# Patient Record
Sex: Female | Born: 1985 | Race: White | Hispanic: No | Marital: Single | Smoking: Current every day smoker
Health system: Southern US, Community
[De-identification: ages and names within clinical notes are randomized; demographics above are authoritative.]

## PROBLEM LIST (undated history)

## (undated) ENCOUNTER — Inpatient Hospital Stay (HOSPITAL_COMMUNITY): Payer: Medicaid Other

## (undated) ENCOUNTER — Inpatient Hospital Stay (HOSPITAL_COMMUNITY): Payer: Self-pay

## (undated) DIAGNOSIS — E119 Type 2 diabetes mellitus without complications: Secondary | ICD-10-CM

## (undated) DIAGNOSIS — B009 Herpesviral infection, unspecified: Secondary | ICD-10-CM

## (undated) HISTORY — PX: SHOULDER SURGERY: SHX246

## (undated) HISTORY — PX: APPENDECTOMY: SHX54

## (undated) HISTORY — DX: Herpesviral infection, unspecified: B00.9

---
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
## 2003-10-05 ENCOUNTER — Emergency Department (HOSPITAL_COMMUNITY): Admission: EM | Admit: 2003-10-05 | Discharge: 2003-10-05 | Payer: Self-pay | Admitting: Emergency Medicine

## 2004-04-04 ENCOUNTER — Other Ambulatory Visit: Admission: RE | Admit: 2004-04-04 | Discharge: 2004-04-04 | Payer: Self-pay | Admitting: Obstetrics and Gynecology

## 2004-09-28 ENCOUNTER — Inpatient Hospital Stay (HOSPITAL_COMMUNITY): Admission: AD | Admit: 2004-09-28 | Discharge: 2004-10-01 | Payer: Self-pay | Admitting: Obstetrics and Gynecology

## 2005-07-15 ENCOUNTER — Inpatient Hospital Stay (HOSPITAL_COMMUNITY): Admission: AD | Admit: 2005-07-15 | Discharge: 2005-07-18 | Payer: Self-pay | Admitting: Psychiatry

## 2005-07-16 ENCOUNTER — Ambulatory Visit: Payer: Self-pay | Admitting: Psychiatry

## 2005-07-24 ENCOUNTER — Ambulatory Visit (HOSPITAL_COMMUNITY): Payer: Self-pay | Admitting: Psychiatry

## 2006-01-31 ENCOUNTER — Emergency Department (HOSPITAL_COMMUNITY): Admission: EM | Admit: 2006-01-31 | Discharge: 2006-01-31 | Payer: Self-pay | Admitting: Emergency Medicine

## 2006-02-01 ENCOUNTER — Inpatient Hospital Stay (HOSPITAL_COMMUNITY): Admission: EM | Admit: 2006-02-01 | Discharge: 2006-02-02 | Payer: Self-pay | Admitting: *Deleted

## 2006-02-02 ENCOUNTER — Inpatient Hospital Stay (HOSPITAL_COMMUNITY): Admission: RE | Admit: 2006-02-02 | Discharge: 2006-02-04 | Payer: Self-pay | Admitting: Psychiatry

## 2006-02-03 ENCOUNTER — Ambulatory Visit: Payer: Self-pay | Admitting: Psychiatry

## 2007-05-10 ENCOUNTER — Emergency Department (HOSPITAL_COMMUNITY): Admission: EM | Admit: 2007-05-10 | Discharge: 2007-05-11 | Payer: Self-pay | Admitting: Emergency Medicine

## 2007-11-13 ENCOUNTER — Emergency Department (HOSPITAL_COMMUNITY): Admission: EM | Admit: 2007-11-13 | Discharge: 2007-11-13 | Payer: Self-pay | Admitting: Emergency Medicine

## 2009-02-01 ENCOUNTER — Emergency Department: Payer: Self-pay | Admitting: Emergency Medicine

## 2009-02-02 ENCOUNTER — Emergency Department (HOSPITAL_COMMUNITY): Admission: EM | Admit: 2009-02-02 | Discharge: 2009-02-02 | Payer: Self-pay | Admitting: Emergency Medicine

## 2009-04-29 ENCOUNTER — Observation Stay: Payer: Self-pay

## 2009-05-31 ENCOUNTER — Ambulatory Visit (HOSPITAL_COMMUNITY): Admission: RE | Admit: 2009-05-31 | Discharge: 2009-05-31 | Payer: Self-pay | Admitting: Obstetrics and Gynecology

## 2009-06-28 ENCOUNTER — Ambulatory Visit (HOSPITAL_COMMUNITY): Admission: RE | Admit: 2009-06-28 | Discharge: 2009-06-28 | Payer: Self-pay | Admitting: Obstetrics and Gynecology

## 2009-07-06 ENCOUNTER — Emergency Department (HOSPITAL_COMMUNITY): Admission: EM | Admit: 2009-07-06 | Discharge: 2009-07-06 | Payer: Self-pay | Admitting: Emergency Medicine

## 2009-09-03 ENCOUNTER — Inpatient Hospital Stay (HOSPITAL_COMMUNITY): Admission: AD | Admit: 2009-09-03 | Discharge: 2009-09-03 | Payer: Self-pay | Admitting: Obstetrics and Gynecology

## 2009-09-18 ENCOUNTER — Inpatient Hospital Stay (HOSPITAL_COMMUNITY): Admission: AD | Admit: 2009-09-18 | Discharge: 2009-09-18 | Payer: Self-pay | Admitting: Obstetrics and Gynecology

## 2009-09-20 ENCOUNTER — Encounter (INDEPENDENT_AMBULATORY_CARE_PROVIDER_SITE_OTHER): Payer: Self-pay | Admitting: Obstetrics and Gynecology

## 2009-09-20 ENCOUNTER — Inpatient Hospital Stay (HOSPITAL_COMMUNITY): Admission: AD | Admit: 2009-09-20 | Discharge: 2009-09-22 | Payer: Self-pay | Admitting: Obstetrics and Gynecology

## 2010-07-26 ENCOUNTER — Emergency Department (HOSPITAL_COMMUNITY): Admission: EM | Admit: 2010-07-26 | Discharge: 2010-07-26 | Payer: Self-pay | Admitting: Emergency Medicine

## 2010-11-20 LAB — CBC
HCT: 31.7 % — ABNORMAL LOW (ref 36.0–46.0)
HCT: 37 % (ref 36.0–46.0)
Hemoglobin: 10.6 g/dL — ABNORMAL LOW (ref 12.0–15.0)
Hemoglobin: 12.3 g/dL (ref 12.0–15.0)
MCHC: 33.5 g/dL (ref 30.0–36.0)
MCV: 100 fL (ref 78.0–100.0)
MCV: 100.5 fL — ABNORMAL HIGH (ref 78.0–100.0)
Platelets: 257 10*3/uL (ref 150–400)
RBC: 3.17 MIL/uL — ABNORMAL LOW (ref 3.87–5.11)
RBC: 3.68 MIL/uL — ABNORMAL LOW (ref 3.87–5.11)
RDW: 14.5 % (ref 11.5–15.5)
WBC: 17.2 10*3/uL — ABNORMAL HIGH (ref 4.0–10.5)

## 2010-11-21 ENCOUNTER — Observation Stay (HOSPITAL_COMMUNITY)
Admission: EM | Admit: 2010-11-21 | Discharge: 2010-11-21 | Disposition: A | Discharge status: Home / Self Care | Payer: Self-pay | Attending: Emergency Medicine | Admitting: Emergency Medicine

## 2010-11-21 DIAGNOSIS — K5289 Other specified noninfective gastroenteritis and colitis: Principal | ICD-10-CM | POA: Insufficient documentation

## 2010-11-21 DIAGNOSIS — R109 Unspecified abdominal pain: Secondary | ICD-10-CM | POA: Insufficient documentation

## 2010-11-21 LAB — URINE MICROSCOPIC-ADD ON

## 2010-11-21 LAB — POCT I-STAT, CHEM 8
Calcium, Ion: 1.07 mmol/L — ABNORMAL LOW (ref 1.12–1.32)
Creatinine, Ser: 0.9 mg/dL (ref 0.4–1.2)
Glucose, Bld: 130 mg/dL — ABNORMAL HIGH (ref 70–99)
HCT: 49 % — ABNORMAL HIGH (ref 36.0–46.0)
Hemoglobin: 16.7 g/dL — ABNORMAL HIGH (ref 12.0–15.0)
TCO2: 24 mmol/L (ref 0–100)

## 2010-11-21 LAB — URINALYSIS, ROUTINE W REFLEX MICROSCOPIC
Bilirubin Urine: NEGATIVE
Glucose, UA: NEGATIVE mg/dL
Hgb urine dipstick: NEGATIVE
Protein, ur: NEGATIVE mg/dL
Urobilinogen, UA: 0.2 mg/dL (ref 0.0–1.0)

## 2010-12-05 LAB — URINALYSIS, ROUTINE W REFLEX MICROSCOPIC
Ketones, ur: >80 mg/dL — AB
Nitrite: NEGATIVE
Specific Gravity, Urine: 1.025 (ref 1.005–1.030)
Urobilinogen, UA: 1 mg/dL (ref 0.0–1.0)
pH: 6.5 (ref 5.0–8.0)

## 2010-12-07 LAB — ABO/RH: ABO/RH(D): O POS

## 2010-12-07 LAB — URINALYSIS, ROUTINE W REFLEX MICROSCOPIC
Bilirubin Urine: NEGATIVE
Glucose, UA: NEGATIVE mg/dL
Glucose, UA: NEGATIVE mg/dL
Ketones, ur: NEGATIVE mg/dL
Nitrite: NEGATIVE
Protein, ur: NEGATIVE mg/dL
Specific Gravity, Urine: 1.011 (ref 1.005–1.030)
Specific Gravity, Urine: 1.019 (ref 1.005–1.030)
Urobilinogen, UA: 0.2 mg/dL (ref 0.0–1.0)
pH: 8 (ref 5.0–8.0)

## 2010-12-07 LAB — COMPREHENSIVE METABOLIC PANEL
ALT: 15 U/L (ref 0–35)
AST: 18 U/L (ref 0–37)
Albumin: 3 g/dL — ABNORMAL LOW (ref 3.5–5.2)
Calcium: 9.1 mg/dL (ref 8.4–10.5)
Creatinine, Ser: 0.58 mg/dL (ref 0.4–1.2)
GFR calc Af Amer: >60 mL/min (ref >60)
Sodium: 137 mEq/L (ref 135–145)
Total Protein: 5.8 g/dL — ABNORMAL LOW (ref 6.0–8.3)

## 2010-12-07 LAB — URINE CULTURE

## 2010-12-07 LAB — BASIC METABOLIC PANEL
CO2: 26 mEq/L (ref 19–32)
Chloride: 104 mEq/L (ref 96–112)
GFR calc Af Amer: >60 mL/min (ref >60)
Potassium: 3.5 mEq/L (ref 3.5–5.1)

## 2010-12-07 LAB — DIFFERENTIAL
Eosinophils Absolute: 0.1 10*3/uL (ref 0.0–0.7)
Eosinophils Absolute: 0.1 10*3/uL (ref 0.0–0.7)
Eosinophils Relative: 0 % (ref 0–5)
Eosinophils Relative: 1 % (ref 0–5)
Lymphocytes Relative: 8 % — ABNORMAL LOW (ref 12–46)
Lymphs Abs: 1.6 10*3/uL (ref 0.7–4.0)
Lymphs Abs: 1.8 10*3/uL (ref 0.7–4.0)
Monocytes Absolute: 1.2 10*3/uL — ABNORMAL HIGH (ref 0.1–1.0)
Monocytes Absolute: 1.2 10*3/uL — ABNORMAL HIGH (ref 0.1–1.0)
Monocytes Relative: 6 % (ref 3–12)
Monocytes Relative: 6 % (ref 3–12)

## 2010-12-07 LAB — CBC
HCT: 33 % — ABNORMAL LOW (ref 36.0–46.0)
MCHC: 34 g/dL (ref 30.0–36.0)
MCV: 99.7 fL (ref 78.0–100.0)
MCV: 99.8 fL (ref 78.0–100.0)
Platelets: 265 10*3/uL (ref 150–400)
RBC: 3.07 MIL/uL — ABNORMAL LOW (ref 3.87–5.11)
RBC: 3.32 MIL/uL — ABNORMAL LOW (ref 3.87–5.11)
WBC: 21.4 10*3/uL — ABNORMAL HIGH (ref 4.0–10.5)

## 2010-12-07 LAB — URINE MICROSCOPIC-ADD ON

## 2010-12-12 LAB — URINE CULTURE

## 2010-12-12 LAB — CBC
MCHC: 32.4 g/dL (ref 30.0–36.0)
RBC: 4.14 MIL/uL (ref 3.87–5.11)
WBC: 17.3 10*3/uL — ABNORMAL HIGH (ref 4.0–10.5)

## 2010-12-12 LAB — COMPREHENSIVE METABOLIC PANEL
ALT: 14 U/L (ref 0–35)
AST: 18 U/L (ref 0–37)
CO2: 25 mEq/L (ref 19–32)
Calcium: 9.2 mg/dL (ref 8.4–10.5)
GFR calc Af Amer: >60 mL/min (ref >60)
GFR calc non Af Amer: >60 mL/min (ref >60)
Sodium: 138 mEq/L (ref 135–145)
Total Protein: 6.9 g/dL (ref 6.0–8.3)

## 2010-12-12 LAB — DIFFERENTIAL
Eosinophils Absolute: 0 10*3/uL (ref 0.0–0.7)
Eosinophils Relative: 0 % (ref 0–5)
Lymphs Abs: 1.7 10*3/uL (ref 0.7–4.0)
Monocytes Relative: 4 % (ref 3–12)

## 2010-12-12 LAB — URINALYSIS, ROUTINE W REFLEX MICROSCOPIC
Bilirubin Urine: NEGATIVE
Glucose, UA: NEGATIVE mg/dL
Hgb urine dipstick: NEGATIVE
Ketones, ur: >80 mg/dL — AB
Protein, ur: NEGATIVE mg/dL

## 2011-01-20 NOTE — Discharge Summary (Signed)
Kayla Diaz, Kayla Diaz         ACCOUNT NO.:  [id number]   MEDICAL RECORD NO.:  [id number]          PATIENT TYPE:  INP   LOCATION:  9147                          FACILITY:  [facility]   PHYSICIAN:  Huel Cote, M.D. DATE OF BIRTH:  June 06, 1986   DATE OF ADMISSION:  09/28/2004  DATE OF DISCHARGE:                                 DISCHARGE SUMMARY   DISCHARGE DIAGNOSES:  1.  Term pregnancy at 40+ weeks, delivered.  2.  Positive group B streptococcus status.  3.  Status post normal spontaneous vaginal delivery.   DISCHARGE MEDICATIONS:  1.  Vicodin 5/500 one to two tablets p.o. q.4h.  2.  Motrin 600 mg p.o. q.6h.   DISCHARGE FOLLOW-UP:  The patient is to follow up in the office in 6 weeks  for her routine postpartum exam.   HOSPITAL COURSE:  The patient is a 25 year old G2 P1-0-0-1 who is admitted  in early labor with rupture of membranes.  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  She also had a positive chlamydia test early in  pregnancy which was treated with azithromycin.  Her prenatal labs are as  follows:  O positive, antibody negative, RPR nonreactive, rubella immune,  hepatitis B surface antigen negative, HIV declined, GC negative, chlamydia  positive and treated appropriately, 1-hour Glucola is 118, group B strep is  positive.  The patient was admitted with contractions and leakage of fluid  and on admission she was 3-4 cm dilated with grossly ruptured membranes.  Past medical history:  None.  Past surgical history:  None except for wisdom  tooth extraction.  Past GYN history:  HSV.  Past medical history:  Asthma  and a possible seizure disorder.  Past obstetrical history:  In 2003 she had  a 7-pound 12-ounce female infant delivered with vacuum assistance.  On  physical exam on admission, she was afebrile with stable vital signs.  Fetal  heart rate was reactive.  Abdomen was soft and  nontender.  Fetal heart tones  were normal with no decelerations.  She was given IV penicillin for her  group B strep positive status.  She continued to progress and reached  complete dilation and pushed a viable female infant over an intact perineum,  Apgars were 8 and 9, weight was 7 pounds 1 ounce.  Placenta was intact.  Uterus was normal.  She was then admitted for routine postpartum care.  On  postpartum day #1 her hemoglobin was down to 9.9 from 11.4.  By postpartum  day #2 she was doing very well.  She was switched from Percocet of Vicodin  given some itching associated with the Percocet which was becoming  increasingly problematic and she did well with this.  She was then  considered stable for discharge home with medications and follow-up as  previously stated.      KR/MEDQ  D:  10/01/2004  T:  10/01/2004  Job:  [id number]

## 2011-01-20 NOTE — Op Note (Signed)
[facility]  Patient:    Kayla Diaz, Kayla Diaz Visit Number: [id number] MRN: [id number]          Service Type: OBS Location: 910A 9114 01 Attending Physician:  Michaele Offer Dictated by:   Zenaida Niece, M.D. Proc. Date: 11/23/01 Admit Date:  11/22/2001                             Operative Report  PREOPERATIVE DIAGNOSIS:  POSTOPERATIVE DIAGNOSIS:  OPERATION:  SURGEON:                      Zenaida Niece, M.D.  DESCRIPTION OF PROCEDURE:     The patient reached completely dilated at a +2 station with the vertex in what felt like an ROA position with asynclitism. However, with contractions, the baby did not tolerate this and had prolonged decelerations into the 70s and 80s for three or four minutes with slow recovery to baseline.  Thus, I discussed assisted deliveries with the patient. I discussed risks and options, and the patient agreed to assistance with a vacuum.  The patients Foley catheter was removed.  She had adequate anesthesia with an epidural.  The self-contained mushroom cup vacuum was applied with the vertex again at +2 station.  With four contractions and three pushes with each contraction, the vertex was brought to the perineum.  There were no pop-offs, although the vacuum did lose pressure two or three times, but not completely. The vacuum was removed with the head at the perineum, and the patient pushed the remainder of the vertex out.  The mouth and nares were suctioned.  Loose nuchal cord x2 was reduced.  The remainder of the infant then delivered atraumatically.  The baby was initially fairly floppy, although it did have a good heart rate.  Cord was doubly clamped and cut, and the infant was taken to the warmer where it was stimulated and responded very well.  The placenta then delivered spontaneous and was intact.  The patient had a posterior laceration which was repaired with 3-0 Vicryl _________  epidural for anesthesia. Estimated blood loss was less than 500 cc.  The patient and baby were recovering well. Dictated by:   Zenaida Niece, M.D. Attending Physician:  Michaele Offer DD:  11/23/01 TD:  11/25/01 Job: [id number]
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
## 2011-05-02 ENCOUNTER — Inpatient Hospital Stay (INDEPENDENT_AMBULATORY_CARE_PROVIDER_SITE_OTHER)
Admission: RE | Admit: 2011-05-02 | Discharge: 2011-05-02 | Disposition: A | Discharge status: Home / Self Care | Payer: Medicaid Other | Source: Ambulatory Visit | Attending: Emergency Medicine | Admitting: Emergency Medicine

## 2011-05-02 DIAGNOSIS — J020 Streptococcal pharyngitis: Secondary | ICD-10-CM

## 2011-05-29 LAB — PREGNANCY, URINE: Preg Test, Ur: NEGATIVE

## 2011-05-29 LAB — ETHANOL: Alcohol, Ethyl (B): <5

## 2011-05-29 LAB — URINE MICROSCOPIC-ADD ON

## 2011-05-29 LAB — BASIC METABOLIC PANEL
Chloride: 102
GFR calc Af Amer: >60
Potassium: 3.5
Sodium: 136

## 2011-05-29 LAB — URINALYSIS, ROUTINE W REFLEX MICROSCOPIC
Glucose, UA: NEGATIVE
Hgb urine dipstick: NEGATIVE
Ketones, ur: 40 — AB
Leukocytes, UA: NEGATIVE
pH: 6

## 2011-05-29 LAB — DIFFERENTIAL
Eosinophils Absolute: 0
Eosinophils Relative: 0
Lymphs Abs: 1.7
Monocytes Relative: 7

## 2011-05-29 LAB — CBC
HCT: 38.9
MCV: 93.7
RBC: 4.16
WBC: 10.7 — ABNORMAL HIGH

## 2011-05-29 LAB — RAPID URINE DRUG SCREEN, HOSP PERFORMED
Barbiturates: NOT DETECTED
Cocaine: NOT DETECTED

## 2011-06-11 ENCOUNTER — Ambulatory Visit (INDEPENDENT_AMBULATORY_CARE_PROVIDER_SITE_OTHER): Payer: Medicaid Other

## 2011-06-11 ENCOUNTER — Inpatient Hospital Stay (INDEPENDENT_AMBULATORY_CARE_PROVIDER_SITE_OTHER)
Admission: RE | Admit: 2011-06-11 | Discharge: 2011-06-11 | Disposition: A | Discharge status: Home / Self Care | Payer: Medicaid Other | Source: Ambulatory Visit | Attending: Emergency Medicine | Admitting: Emergency Medicine

## 2011-06-11 DIAGNOSIS — S0003XA Contusion of scalp, initial encounter: Secondary | ICD-10-CM

## 2011-06-16 LAB — RAPID STREP SCREEN (MED CTR MEBANE ONLY): Streptococcus, Group A Screen (Direct): NEGATIVE

## 2011-07-25 ENCOUNTER — Emergency Department: Payer: Self-pay | Admitting: Unknown Physician Specialty

## 2011-08-05 ENCOUNTER — Emergency Department: Payer: Self-pay | Admitting: Emergency Medicine

## 2011-08-08 ENCOUNTER — Emergency Department: Payer: Self-pay | Admitting: Emergency Medicine

## 2011-08-31 ENCOUNTER — Encounter: Payer: Self-pay | Admitting: *Deleted

## 2011-08-31 ENCOUNTER — Emergency Department (HOSPITAL_COMMUNITY)
Admission: EM | Admit: 2011-08-31 | Discharge: 2011-08-31 | Disposition: A | Discharge status: Home / Self Care | Payer: Medicaid Other | Source: Home / Self Care | Attending: Family Medicine | Admitting: Family Medicine

## 2011-08-31 DIAGNOSIS — B999 Unspecified infectious disease: Secondary | ICD-10-CM

## 2011-08-31 DIAGNOSIS — L738 Other specified follicular disorders: Secondary | ICD-10-CM

## 2011-08-31 MED ORDER — DOXYCYCLINE HYCLATE 100 MG PO CAPS: 100.0000 mg | ORAL_CAPSULE | Freq: Two times a day (BID) | ORAL | Status: DC

## 2011-08-31 NOTE — ED Provider Notes (Signed)
History     CSN: [id number]  Arrival date & time 08/31/11  1629   First MD Initiated Contact with Patient 08/31/11 1635      Chief Complaint  Patient presents with  . Abscess    (Consider location/radiation/quality/duration/timing/severity/associated sxs/prior treatment) Patient is a 25 y.o. female presenting with abscess. The history is provided by the patient.  Abscess  This is a new problem. The current episode started yesterday. The onset was gradual. The problem has been gradually worsening. The abscess is present on the face. The problem is mild. The abscess is characterized by redness and painfulness. It is unknown what she was exposed to. The abscess first occurred at home.    History reviewed. No pertinent past medical history.  Past Surgical History  Procedure Date  . Shoulder surgery     History reviewed. No pertinent family history.  History  Substance Use Topics  . Smoking status: Current Everyday Smoker  . Smokeless tobacco: Not on file  . Alcohol Use: No    OB History    Grav Para Term Preterm Abortions TAB SAB Ect Mult Living                  Review of Systems  Constitutional: Negative.   Skin: Positive for rash.    Allergies  Review of patient's allergies indicates no known allergies.  Home Medications   Current Outpatient Rx  Name Route Sig Dispense Refill  . DOXYCYCLINE HYCLATE 100 MG PO CAPS Oral Take 1 capsule (100 mg total) by mouth 2 (two) times daily. 30 capsule 0    BP 115/72  Pulse 96  Temp(Src) 98.4 F (36.9 C) (Oral)  Resp 22  SpO2 100%  Physical Exam  Constitutional: She appears well-developed and well-nourished.  HENT:  Head: Normocephalic.  Skin: Skin is warm and dry. Rash noted. There is erythema.       ED Course  Procedures (including critical care time)  Labs Reviewed - No data to display No results found.   1. Infectious folliculitis       MDM          Barkley Bruns, MD 09/01/11  [phone]

## 2011-08-31 NOTE — ED Notes (Signed)
Pt  Noticed  Small  Red       Area   Which  She  Noticed      She  scubbed   It    And  It  Got  Worse    Early  This  Am

## 2011-09-01 ENCOUNTER — Telehealth (HOSPITAL_COMMUNITY): Payer: Self-pay | Admitting: *Deleted

## 2011-09-01 ENCOUNTER — Telehealth (HOSPITAL_COMMUNITY): Payer: Self-pay | Admitting: Family Medicine

## 2011-09-01 MED ORDER — CLINDAMYCIN HCL 300 MG PO CAPS: 300.0000 mg | ORAL_CAPSULE | Freq: Four times a day (QID) | ORAL | Status: AC

## 2011-09-01 NOTE — Telephone Encounter (Signed)
Patient called stating that she is pregnant therefore doxycycline is not safe. Plan to switch to clindamycin prescription called into pharmacy.

## 2011-09-23 ENCOUNTER — Emergency Department (HOSPITAL_COMMUNITY)
Admission: EM | Admit: 2011-09-23 | Discharge: 2011-09-24 | Disposition: A | Discharge status: Home / Self Care | Payer: Medicaid Other | Attending: Emergency Medicine | Admitting: Emergency Medicine

## 2011-09-23 ENCOUNTER — Encounter (HOSPITAL_COMMUNITY): Payer: Self-pay | Admitting: *Deleted

## 2011-09-23 DIAGNOSIS — O034 Incomplete spontaneous abortion without complication: Secondary | ICD-10-CM

## 2011-09-23 DIAGNOSIS — R109 Unspecified abdominal pain: Secondary | ICD-10-CM | POA: Insufficient documentation

## 2011-09-23 DIAGNOSIS — N898 Other specified noninflammatory disorders of vagina: Secondary | ICD-10-CM | POA: Insufficient documentation

## 2011-09-23 NOTE — ED Notes (Signed)
Patient is about [redacted] weeks pregnant and today she noticed blood clots and bleeding.  Patient is also experiencing abdominal cramps

## 2011-09-24 ENCOUNTER — Emergency Department: Payer: Self-pay | Admitting: Emergency Medicine

## 2011-09-24 LAB — CBC
HCT: 37.2 % (ref 35.0–47.0)
HGB: 12.9 g/dL (ref 12.0–16.0)
Hemoglobin: 13 g/dL (ref 12.0–15.0)
MCH: 32.4 pg (ref 26.0–34.0)
MCH: 33.8 pg (ref 26.0–34.0)
MCV: 97 fL (ref 80–100)
Platelet: 240 10*3/uL (ref 150–440)
RBC: 4.01 MIL/uL (ref 3.87–5.11)
RBC: 3.83 10*6/uL (ref 3.80–5.20)
RDW: 13.2 % (ref 11.5–14.5)
WBC: 16.4 10*3/uL — ABNORMAL HIGH (ref 3.6–11.0)

## 2011-09-24 LAB — DIFFERENTIAL
Lymphs Abs: 2.9 10*3/uL (ref 0.7–4.0)
Monocytes Relative: 6 % (ref 3–12)
Neutro Abs: 10.9 10*3/uL — ABNORMAL HIGH (ref 1.7–7.7)
Neutrophils Relative %: 74 % (ref 43–77)

## 2011-09-24 LAB — BASIC METABOLIC PANEL
Anion Gap: 10 (ref 7–16)
BUN: 9 mg/dL (ref 7–18)
CO2: 26 mEq/L (ref 19–32)
Chloride: 102 mEq/L (ref 96–112)
Chloride: 106 mmol/L (ref 98–107)
Creatinine: 0.61 mg/dL (ref 0.60–1.30)
EGFR (Non-African Amer.): >60
Glucose: 80 mg/dL (ref 65–99)
Osmolality: 283 (ref 275–301)
Sodium: 138 mEq/L (ref 135–145)
Sodium: 143 mmol/L (ref 136–145)

## 2011-09-24 LAB — ABO/RH: ABO/RH(D): O POS

## 2011-09-24 MED ORDER — POTASSIUM CHLORIDE CRYS ER 20 MEQ PO TBCR
20.0000 meq | EXTENDED_RELEASE_TABLET | Freq: Once | ORAL | Status: AC
  Administered 2011-09-24: 20 meq via ORAL

## 2011-09-24 MED ORDER — ACETAMINOPHEN 325 MG PO TABS
650.0000 mg | ORAL_TABLET | Freq: Once | ORAL | Status: DC
  Filled 2011-09-24: qty 2

## 2011-09-24 MED ORDER — POTASSIUM CHLORIDE CRYS ER 20 MEQ PO TBCR
EXTENDED_RELEASE_TABLET | ORAL | Status: AC
  Administered 2011-09-24: 20 meq via ORAL
  Filled 2011-09-24: qty 1

## 2011-09-24 NOTE — ED Notes (Signed)
Fetal heart tone not heard.

## 2011-09-24 NOTE — ED Provider Notes (Signed)
History     CSN: [id number]  Arrival date & time 09/23/11  2341   First MD Initiated Contact with Patient 09/24/11 0036      Chief Complaint  Patient presents with  . Miscarriage    (Consider location/radiation/quality/duration/timing/severity/associated sxs/prior treatment) The history is provided by the patient.   vaginal bleeding. Onset today. Patient believes she is about [redacted] weeks pregnant. She went to the emergency department and [place] about 7 weeks ago and told she was around [redacted] weeks pregnant. She was given referral [facility], but has not followed up yet. She is taking prenatal vitamins. She is G4 P3 with 3 healthy children. She has not had problems with this pregnancy until today. She has some mild lower bowel cramping. When she started passing blood she presented occurs the emergency department where she was told she may be having a miscarriage. She passed more blood after going home and became concerned so now presents here. She denies any fever, nausea or vomiting. No history of same. Patient is very anxious and concerned about a possible miscarriage. No passage of tissue. No dizziness or near syncope. Moderate in severity. Currently no pain or bleeding.  History reviewed. No pertinent past medical history.  Past Surgical History  Procedure Date  . Shoulder surgery     History reviewed. No pertinent family history.  History  Substance Use Topics  . Smoking status: Current Everyday Smoker  . Smokeless tobacco: Not on file  . Alcohol Use: No    OB History    Grav Para Term Preterm Abortions TAB SAB Ect Mult Living   4 3              Review of Systems  Constitutional: Negative for fever and chills.  HENT: Negative for neck pain and neck stiffness.   Eyes: Negative for pain.  Respiratory: Negative for shortness of breath.   Cardiovascular: Negative for chest pain.  Gastrointestinal: Negative for nausea, vomiting, abdominal pain and diarrhea.    Genitourinary: Positive for vaginal bleeding. Negative for dysuria, urgency, flank pain, vaginal pain and pelvic pain.  Musculoskeletal: Negative for back pain.  Skin: Negative for rash.  Neurological: Negative for headaches.  All other systems reviewed and are negative.    Allergies  Prednisone  Home Medications  No current outpatient prescriptions on file.  BP 113/67  Pulse 95  Temp(Src) 97.8 F (36.6 C) (Oral)  Resp 20  SpO2 97%  Physical Exam  Constitutional: She is oriented to person, place, and time. She appears well-developed and well-nourished.  HENT:  Head: Normocephalic and atraumatic.  Eyes: Conjunctivae and EOM are normal. Pupils are equal, round, and reactive to light.  Neck: Trachea normal. Neck supple.  Cardiovascular: Normal rate, regular rhythm, S1 normal, S2 normal and normal pulses.     No systolic murmur is present   No diastolic murmur is present  Pulses:      Radial pulses are 2+ on the right side, and 2+ on the left side.  Pulmonary/Chest: Effort normal and breath sounds normal. She has no rhonchi.  Abdominal: Soft. Normal appearance and bowel sounds are normal. There is no tenderness. There is no rebound, no guarding, no CVA tenderness and negative Murphy's sign.       Gravid uterus  Musculoskeletal:       BLE:s Calves nontender, no cords or erythema, negative Homans sign  Neurological: She is alert and oriented to person, place, and time. She has normal strength. No cranial nerve deficit or  sensory deficit. GCS eye subscore is 4. GCS verbal subscore is 5. GCS motor subscore is 6.  Skin: Skin is warm and dry. No rash noted. She is not diaphoretic.  Psychiatric: Her speech is normal.       Cooperative and appropriate    ED Course  [place] bedside Date/Time: 09/24/2011 1:36 AM Performed by: Sunnie Nielsen Authorized by: Sunnie Nielsen Consent: Verbal consent obtained. Risks and benefits: risks, benefits and alternatives were discussed Consent given by:  patient Patient understanding: patient states understanding of the procedure being performed Patient consent: the patient's understanding of the procedure matches consent given Procedure consent: procedure consent matches procedure scheduled Patient identity confirmed: verbally with patient Time out: Immediately prior to procedure a "time out" was called to verify the correct patient, procedure, equipment, support staff and site/side marked as required. Preparation: Patient was prepped and draped in the usual sterile fashion. Comments: Indication pregnancy. Enlarged uterus. Fetal products with No evidence of cardiac activity. Fetal heart tones assessed without any fetal heart tones appreciated.   (including critical care time)  Labs Reviewed  CBC - Abnormal; Notable for the following:    WBC 14.8 (*)    All other components within normal limits  DIFFERENTIAL - Abnormal; Notable for the following:    Neutro Abs 10.9 (*)    All other components within normal limits  ABO/RH  HCG, QUANTITATIVE, PREGNANCY  BASIC METABOLIC PANEL   Limited bedside Ultrasound results shared with patient, likely miscarriage. Quant beta hCG sent and reck OB/GYN followup on Monday in clinic. Patient states understanding discharge instructions and need to be evaluated for any symptoms of heavy bleeding with associated symptoms of anemia.   MDM   Vaginal bleeding first trimester likely miscarriage. Patient given instructions for threatened miscarriage and plan close OB/GYN followup. She is stable for discharge home. All questions answered.       Sunnie Nielsen, MD 09/24/11 [phone]

## 2011-09-24 NOTE — ED Notes (Signed)
Advised MD patient refused acetaminophen.  Asked if I could administer Bentyl instead.

## 2011-09-24 NOTE — ED Notes (Signed)
Patient is AOx4 and understands her discharge instructions. 

## 2011-11-21 ENCOUNTER — Encounter (HOSPITAL_COMMUNITY): Payer: Self-pay | Admitting: Emergency Medicine

## 2011-11-21 ENCOUNTER — Emergency Department (HOSPITAL_COMMUNITY)
Admission: EM | Admit: 2011-11-21 | Discharge: 2011-11-21 | Disposition: A | Discharge status: Home / Self Care | Payer: Self-pay | Attending: Emergency Medicine | Admitting: Emergency Medicine

## 2011-11-21 DIAGNOSIS — F17200 Nicotine dependence, unspecified, uncomplicated: Secondary | ICD-10-CM | POA: Insufficient documentation

## 2011-11-21 DIAGNOSIS — R197 Diarrhea, unspecified: Secondary | ICD-10-CM | POA: Insufficient documentation

## 2011-11-21 DIAGNOSIS — Z9889 Other specified postprocedural states: Secondary | ICD-10-CM | POA: Insufficient documentation

## 2011-11-21 DIAGNOSIS — R112 Nausea with vomiting, unspecified: Secondary | ICD-10-CM | POA: Insufficient documentation

## 2011-11-21 DIAGNOSIS — IMO0001 Reserved for inherently not codable concepts without codable children: Secondary | ICD-10-CM | POA: Insufficient documentation

## 2011-11-21 LAB — DIFFERENTIAL
Eosinophils Relative: 0 % (ref 0–5)
Lymphocytes Relative: 7 % — ABNORMAL LOW (ref 12–46)
Lymphs Abs: 0.8 10*3/uL (ref 0.7–4.0)
Monocytes Absolute: 0.5 10*3/uL (ref 0.1–1.0)

## 2011-11-21 LAB — URINALYSIS, ROUTINE W REFLEX MICROSCOPIC
Glucose, UA: NEGATIVE mg/dL
Hgb urine dipstick: NEGATIVE
Leukocytes, UA: NEGATIVE
Protein, ur: NEGATIVE mg/dL
Specific Gravity, Urine: 1.03 (ref 1.005–1.030)

## 2011-11-21 LAB — COMPREHENSIVE METABOLIC PANEL
ALT: 46 U/L — ABNORMAL HIGH (ref 0–35)
CO2: 24 mEq/L (ref 19–32)
Calcium: 9.3 mg/dL (ref 8.4–10.5)
Creatinine, Ser: 0.75 mg/dL (ref 0.50–1.10)
GFR calc Af Amer: >90 mL/min (ref >90)
GFR calc non Af Amer: >90 mL/min (ref >90)
Glucose, Bld: 128 mg/dL — ABNORMAL HIGH (ref 70–99)
Sodium: 135 mEq/L (ref 135–145)
Total Bilirubin: 0.3 mg/dL (ref 0.3–1.2)

## 2011-11-21 LAB — POCT I-STAT, CHEM 8
BUN: 15 mg/dL (ref 6–23)
Hemoglobin: 15.3 g/dL — ABNORMAL HIGH (ref 12.0–15.0)
Potassium: 3.7 mEq/L (ref 3.5–5.1)
Sodium: 141 mEq/L (ref 135–145)
TCO2: 23 mmol/L (ref 0–100)

## 2011-11-21 LAB — POCT PREGNANCY, URINE: Preg Test, Ur: NEGATIVE

## 2011-11-21 LAB — CBC
HCT: 40.3 % (ref 36.0–46.0)
MCV: 91.4 fL (ref 78.0–100.0)
RBC: 4.41 MIL/uL (ref 3.87–5.11)
WBC: 10.9 10*3/uL — ABNORMAL HIGH (ref 4.0–10.5)

## 2011-11-21 MED ORDER — OXYCODONE-ACETAMINOPHEN 5-325 MG PO TABS
1.0000 | ORAL_TABLET | Freq: Once | ORAL | Status: AC
  Administered 2011-11-21: 1 via ORAL
  Filled 2011-11-21: qty 1

## 2011-11-21 MED ORDER — PROMETHAZINE HCL 25 MG/ML IJ SOLN
12.5000 mg | Freq: Once | INTRAMUSCULAR | Status: AC
  Administered 2011-11-21: 07:00:00 via INTRAVENOUS
  Filled 2011-11-21: qty 1

## 2011-11-21 MED ORDER — SODIUM CHLORIDE 0.9 % IV SOLN
Freq: Once | INTRAVENOUS | Status: AC
  Administered 2011-11-21: 03:00:00 via INTRAVENOUS

## 2011-11-21 MED ORDER — PROMETHAZINE HCL 25 MG RE SUPP: 25.0000 mg | Freq: Four times a day (QID) | RECTAL | Status: DC | PRN

## 2011-11-21 MED ORDER — SODIUM CHLORIDE 0.9 % IV BOLUS (SEPSIS)
1000.0000 mL | Freq: Once | INTRAVENOUS | Status: AC
  Administered 2011-11-21: 1000 mL via INTRAVENOUS

## 2011-11-21 MED ORDER — PROMETHAZINE HCL 25 MG/ML IJ SOLN
12.5000 mg | Freq: Once | INTRAMUSCULAR | Status: AC
  Administered 2011-11-21: 12.5 mg via INTRAVENOUS
  Filled 2011-11-21: qty 1

## 2011-11-21 NOTE — ED Notes (Signed)
Pt found laying in the hallway in triage  Pt states she is unable to get up  Told pt to get up and go back to her room  After much difficulty pt got up and back to her triage room  Pt crying

## 2011-11-21 NOTE — ED Provider Notes (Signed)
History     CSN: [id number]  Arrival date & time 11/21/11  0117   First MD Initiated Contact with Patient 11/21/11 0247      Chief Complaint  Patient presents with  . Emesis  . Nausea  . Generalized Body Aches    (Consider location/radiation/quality/duration/timing/severity/associated sxs/prior treatment) Patient is a 26 y.o. female presenting with vomiting. The history is provided by the patient. No language interpreter was used.  Emesis  This is a new problem. The current episode started 1 to 2 hours ago. The problem occurs 2 to 4 times per day. The problem has not changed since onset.The emesis has an appearance of stomach contents. There has been no fever. Associated symptoms include diarrhea and myalgias. Pertinent negatives include no cough and no URI. Risk factors: unknown.    History reviewed. No pertinent past medical history.  Past Surgical History  Procedure Date  . Shoulder surgery     Family History  Problem Relation Age of Onset  . Cancer Other     History  Substance Use Topics  . Smoking status: Current Everyday Smoker    Types: Cigarettes  . Smokeless tobacco: Not on file  . Alcohol Use: No    OB History    Grav Para Term Preterm Abortions TAB SAB Ect Mult Living   4 3              Review of Systems  Constitutional: Negative.   HENT: Negative.   Eyes: Negative.   Respiratory: Negative for cough.   Cardiovascular: Negative.   Gastrointestinal: Positive for nausea, vomiting and diarrhea.  Genitourinary: Negative.   Musculoskeletal: Positive for myalgias.  Neurological: Negative.   Hematological: Negative.   Psychiatric/Behavioral: Negative.     Allergies  Zofran and Prednisone  Home Medications   Current Outpatient Rx  Name Route Sig Dispense Refill  . PROMETHAZINE HCL 25 MG RE SUPP Rectal Place 1 suppository (25 mg total) rectally every 6 (six) hours as needed for nausea. 12 each 0    BP 104/67  Pulse 103  Temp(Src) 99.5 F  (37.5 C) (Oral)  Resp 18  SpO2 98%  LMP 11/04/2011  Breastfeeding? Unknown  Physical Exam  Constitutional: She is oriented to person, place, and time. She appears well-developed and well-nourished. No distress.  HENT:  Head: Normocephalic.  Mouth/Throat: Oropharynx is clear and moist.  Eyes: Conjunctivae are normal. Pupils are equal, round, and reactive to light.  Neck: Normal range of motion. Neck supple.  Cardiovascular: Normal rate and regular rhythm.   Pulmonary/Chest: Effort normal and breath sounds normal.  Abdominal: Soft. Bowel sounds are normal. There is no tenderness. There is no rebound and no guarding.  Musculoskeletal: Normal range of motion.  Neurological: She is alert and oriented to person, place, and time.  Skin: Skin is warm and dry.  Psychiatric: She has a normal mood and affect.    ED Course  Procedures (including critical care time)  Labs Reviewed  CBC - Abnormal; Notable for the following:    WBC 10.9 (*)    All other components within normal limits  DIFFERENTIAL - Abnormal; Notable for the following:    Neutrophils Relative 87 (*)    Neutro Abs 9.5 (*)    Lymphocytes Relative 7 (*)    All other components within normal limits  COMPREHENSIVE METABOLIC PANEL - Abnormal; Notable for the following:    Glucose, Bld 128 (*)    AST 41 (*)    ALT 46 (*)  All other components within normal limits  URINALYSIS, ROUTINE W REFLEX MICROSCOPIC - Abnormal; Notable for the following:    APPearance CLOUDY (*)    Bilirubin Urine SMALL (*)    Ketones, ur 40 (*)    All other components within normal limits  POCT I-STAT, CHEM 8 - Abnormal; Notable for the following:    Glucose, Bld 127 (*)    Calcium, Ion 1.11 (*)    Hemoglobin 15.3 (*)    All other components within normal limits  POCT PREGNANCY, URINE   No results found.   1. Nausea vomiting and diarrhea       MDM  History and exam consistent with viral illness. Reassuring, no indications for imaging  at this time        Edgard Debord Smitty Cords, MD 11/21/11 [phone]

## 2011-11-21 NOTE — ED Notes (Signed)
Pt states that she might be pregnant and had her period 2.5 weeks ago.

## 2011-11-21 NOTE — ED Notes (Signed)
Pt drank Sprite and 1 cracker, no new reports of vomiting, Dr. Nicanor Alcon aware, will monitor.

## 2011-11-21 NOTE — Discharge Instructions (Signed)
B.R.A.T. Diet Your doctor has recommended the B.R.A.T. diet for you or your child until the condition improves. This is often used to help control diarrhea and vomiting symptoms. If you or your child can tolerate clear liquids, you may have:  Bananas.   Rice.   Applesauce.   Toast (and other simple starches such as crackers, potatoes, noodles).  Be sure to avoid dairy products, meats, and fatty foods until symptoms are better. Fruit juices such as apple, grape, and prune juice can make diarrhea worse. Avoid these. Continue this diet for 2 days or as instructed by your caregiver. Document Released: 08/21/2005 Document Revised: 08/10/2011 Document Reviewed: 02/07/2007 Exit[employer] Patient Information 2012 [employer]. 

## 2011-11-21 NOTE — ED Notes (Signed)
Pt states tonight starting around 10pm she started having nausea, vomiting, and diarrhea and has not been able to stop since  Pt actively vomiting in triage and has been back and forth to the restroom several times  Pt is c/o abd pain as well

## 2011-11-22 LAB — GLUCOSE, CAPILLARY: Glucose-Capillary: 126 mg/dL — ABNORMAL HIGH (ref 70–99)

## 2011-12-28 ENCOUNTER — Encounter (HOSPITAL_COMMUNITY): Payer: Self-pay | Admitting: *Deleted

## 2011-12-28 ENCOUNTER — Emergency Department (HOSPITAL_COMMUNITY): Payer: Self-pay

## 2011-12-28 ENCOUNTER — Emergency Department (HOSPITAL_COMMUNITY)
Admission: EM | Admit: 2011-12-28 | Discharge: 2011-12-28 | Disposition: A | Discharge status: Home / Self Care | Payer: Self-pay | Attending: Emergency Medicine | Admitting: Emergency Medicine

## 2011-12-28 DIAGNOSIS — R0789 Other chest pain: Secondary | ICD-10-CM

## 2011-12-28 DIAGNOSIS — R072 Precordial pain: Secondary | ICD-10-CM | POA: Insufficient documentation

## 2011-12-28 DIAGNOSIS — F17200 Nicotine dependence, unspecified, uncomplicated: Secondary | ICD-10-CM | POA: Insufficient documentation

## 2011-12-28 DIAGNOSIS — M549 Dorsalgia, unspecified: Secondary | ICD-10-CM | POA: Insufficient documentation

## 2011-12-28 DIAGNOSIS — R0602 Shortness of breath: Secondary | ICD-10-CM | POA: Insufficient documentation

## 2011-12-28 LAB — COMPREHENSIVE METABOLIC PANEL
ALT: 19 U/L (ref 0–35)
AST: 21 U/L (ref 0–37)
Albumin: 3.7 g/dL (ref 3.5–5.2)
Calcium: 9 mg/dL (ref 8.4–10.5)
Sodium: 138 mEq/L (ref 135–145)
Total Protein: 6.3 g/dL (ref 6.0–8.3)

## 2011-12-28 LAB — DIFFERENTIAL
Basophils Absolute: 0 10*3/uL (ref 0.0–0.1)
Eosinophils Absolute: 0.2 10*3/uL (ref 0.0–0.7)
Eosinophils Relative: 3 % (ref 0–5)
Neutrophils Relative %: 51 % (ref 43–77)

## 2011-12-28 LAB — CBC
MCH: 29.6 pg (ref 26.0–34.0)
MCV: 88.1 fL (ref 78.0–100.0)
Platelets: 174 10*3/uL (ref 150–400)
RDW: 13.6 % (ref 11.5–15.5)
WBC: 7 10*3/uL (ref 4.0–10.5)

## 2011-12-28 LAB — D-DIMER, QUANTITATIVE: D-Dimer, Quant: 0.24 ug/mL-FEU (ref 0.00–0.48)

## 2011-12-28 LAB — CK TOTAL AND CKMB (NOT AT ARMC): Relative Index: INVALID (ref 0.0–2.5)

## 2011-12-28 LAB — TROPONIN I: Troponin I: <0.3 ng/mL (ref <0.30)

## 2011-12-28 MED ORDER — KETOROLAC TROMETHAMINE 60 MG/2ML IM SOLN
60.0000 mg | Freq: Once | INTRAMUSCULAR | Status: AC
  Administered 2011-12-28: 60 mg via INTRAMUSCULAR
  Filled 2011-12-28: qty 2

## 2011-12-28 MED ORDER — NAPROXEN 500 MG PO TABS: 500.0000 mg | ORAL_TABLET | Freq: Two times a day (BID) | ORAL | Status: DC

## 2011-12-28 MED ORDER — IBUPROFEN 800 MG PO TABS
800.0000 mg | ORAL_TABLET | Freq: Once | ORAL | Status: DC
  Filled 2011-12-28: qty 1

## 2011-12-28 NOTE — Discharge Instructions (Signed)
Chest Wall Pain Chest wall pain is pain in or around the bones and muscles of your chest. It may take up to 6 weeks to get better. It may take longer if you must stay physically active in your work and activities.  CAUSES  Chest wall pain may happen on its own. However, it may be caused by:  A viral illness like the flu.   Injury.   Coughing.   Exercise.   Arthritis.   Fibromyalgia.   Shingles.  HOME CARE INSTRUCTIONS   Avoid overtiring physical activity. Try not to strain or perform activities that cause pain. This includes any activities using your chest or your abdominal and side muscles, especially if heavy weights are used.   Put ice on the sore area.   Put ice in a plastic bag.   Place a towel between your skin and the bag.   Leave the ice on for 15 to 20 minutes per hour while awake for the first 2 days.   Only take over-the-counter or prescription medicines for pain, discomfort, or fever as directed by your caregiver.  SEEK IMMEDIATE MEDICAL CARE IF:   Your pain increases, or you are very uncomfortable.   You have a fever.   Your chest pain becomes worse.   You have new, unexplained symptoms.   You have nausea or vomiting.   You feel sweaty or lightheaded.   You have a cough with phlegm (sputum), or you cough up blood.  MAKE SURE YOU:   Understand these instructions.   Will watch your condition.   Will get help right away if you are not doing well or get worse.  Document Released: 08/21/2005 Document Revised: 08/10/2011 Document Reviewed: 04/17/2011 [employer] Patient Information 2012 [employer]. 

## 2011-12-28 NOTE — ED Provider Notes (Signed)
History     CSN: [id number]  Arrival date & time 12/28/11  1925   First MD Initiated Contact with Patient 12/28/11 2044      Chief Complaint  Patient presents with  . Chest Pain    (Consider location/radiation/quality/duration/timing/severity/associated sxs/prior treatment) HPI Comments: Patient presents with sudden onset substernal chest pain that occurred while she was driving in his lasted about 2 hours. It is since improved, still somewhat better. The pain radiates to her back associated with shortness of breath. She is a smoker but denies any other medical history. She has never had this before. She denies any cough or fever. She is not on any birth control. Denies abdominal pain, nausea or vomiting. No leg pain, swelling or recent travel  The history is provided by the patient.    History reviewed. No pertinent past medical history.  Past Surgical History  Procedure Date  . Shoulder surgery     Family History  Problem Relation Age of Onset  . Cancer Other     History  Substance Use Topics  . Smoking status: Current Everyday Smoker    Types: Cigarettes  . Smokeless tobacco: Not on file  . Alcohol Use: No    OB History    Grav Para Term Preterm Abortions TAB SAB Ect Mult Living   4 3              Review of Systems  Constitutional: Negative for fever, activity change and appetite change.  HENT: Negative for congestion and rhinorrhea.   Respiratory: Positive for chest tightness and shortness of breath. Negative for cough.   Cardiovascular: Positive for chest pain. Negative for leg swelling.  Gastrointestinal: Negative for nausea, vomiting and abdominal pain.  Genitourinary: Negative for dysuria.  Neurological: Negative for dizziness and headaches.    Allergies  Peanut-containing drug products; Ibuprofen; Zofran; Chocolate; and Prednisone  Home Medications   Current Outpatient Rx  Name Route Sig Dispense Refill  . NAPROXEN 500 MG PO TABS Oral Take 1  tablet (500 mg total) by mouth 2 (two) times daily. 30 tablet 0    BP 125/78  Pulse 80  Temp(Src) 97.7 F (36.5 C) (Oral)  Resp 18  SpO2 100%  LMP 12/07/2011  Physical Exam  Constitutional: She is oriented to person, place, and time. She appears well-developed. No distress.  HENT:  Head: Normocephalic and atraumatic.  Mouth/Throat: Oropharynx is clear and moist. No oropharyngeal exudate.  Eyes: Conjunctivae are normal. Pupils are equal, round, and reactive to light.  Neck: Normal range of motion. Neck supple.  Cardiovascular: Normal rate, regular rhythm and normal heart sounds.   No murmur heard. Pulmonary/Chest: Effort normal and breath sounds normal. No respiratory distress. She exhibits tenderness.       Somewhat reproducible  Abdominal: Soft. There is no tenderness. There is no rebound and no guarding.  Musculoskeletal: Normal range of motion. She exhibits no edema and no tenderness.       No swelling, asymmetry or palpable cords  Neurological: She is alert and oriented to person, place, and time. No cranial nerve deficit.  Skin: Skin is warm.    ED Course  Procedures (including critical care time)  Labs Reviewed  CBC - Abnormal; Notable for the following:    Hemoglobin 11.9 (*)    HCT 35.4 (*)    All other components within normal limits  COMPREHENSIVE METABOLIC PANEL - Abnormal; Notable for the following:    Total Bilirubin 0.1 (*)    All other  components within normal limits  DIFFERENTIAL  CK TOTAL AND CKMB  TROPONIN I  D-DIMER, QUANTITATIVE  URINE RAPID DRUG SCREEN (HOSP PERFORMED)   Dg Chest 2 View  12/28/2011  *RADIOLOGY REPORT*  Clinical Data: Chest pain  CHEST - 2 VIEW  Comparison: 01/31/2006  Findings: Cardiomediastinal silhouette is within normal limits. The lungs are clear. No pleural effusion.  No pneumothorax.  No acute osseous abnormality.  IMPRESSION: Normal chest.  Original Report Authenticated By: Harrel Lemon, M.D.     1. Chest wall pain         MDM  Chest wall pain, improving. Vitals stable, lungs clear.  CXR negative. Pain improved with toradol. PERC negative.     Date: 12/28/2011  Rate: 87  Rhythm: normal sinus rhythm  QRS Axis: normal  Intervals: normal  ST/T Wave abnormalities: normal  Conduction Disutrbances:none  Narrative Interpretation:   Old EKG Reviewed: none available          Glynn Octave, MD 12/28/11 2337

## 2011-12-28 NOTE — ED Notes (Signed)
The pt was driving and she started having severe chest pain  And some sob.  No previous history.

## 2011-12-28 NOTE — ED Notes (Signed)
PT refuses chest x-ray  At this time.  Pt also states she is unable to provide a urine sample at this time. EDP aware.

## 2011-12-28 NOTE — ED Notes (Signed)
The pt took the injection of toradol but  And refused the ibu.  She says it tears her stomach up

## 2012-10-28 ENCOUNTER — Emergency Department (HOSPITAL_COMMUNITY): Payer: Self-pay

## 2012-10-28 ENCOUNTER — Encounter (HOSPITAL_COMMUNITY): Payer: Self-pay | Admitting: *Deleted

## 2012-10-28 ENCOUNTER — Emergency Department (HOSPITAL_COMMUNITY)
Admission: EM | Admit: 2012-10-28 | Discharge: 2012-10-29 | Disposition: A | Discharge status: Home / Self Care | Payer: Self-pay | Attending: Emergency Medicine | Admitting: Emergency Medicine

## 2012-10-28 DIAGNOSIS — F17200 Nicotine dependence, unspecified, uncomplicated: Secondary | ICD-10-CM | POA: Insufficient documentation

## 2012-10-28 DIAGNOSIS — O210 Mild hyperemesis gravidarum: Secondary | ICD-10-CM | POA: Insufficient documentation

## 2012-10-28 DIAGNOSIS — O219 Vomiting of pregnancy, unspecified: Secondary | ICD-10-CM

## 2012-10-28 LAB — LIPASE, BLOOD: Lipase: 21 U/L (ref 11–59)

## 2012-10-28 LAB — URINALYSIS, MICROSCOPIC ONLY
Bilirubin Urine: NEGATIVE
Glucose, UA: NEGATIVE mg/dL
Hgb urine dipstick: NEGATIVE
Ketones, ur: NEGATIVE mg/dL
pH: 7 (ref 5.0–8.0)

## 2012-10-28 LAB — COMPREHENSIVE METABOLIC PANEL
ALT: 9 U/L (ref 0–35)
Albumin: 3.7 g/dL (ref 3.5–5.2)
Alkaline Phosphatase: 51 U/L (ref 39–117)
GFR calc Af Amer: >90 mL/min (ref >90)
Glucose, Bld: 113 mg/dL — ABNORMAL HIGH (ref 70–99)
Potassium: 3.5 mEq/L (ref 3.5–5.1)
Sodium: 134 mEq/L — ABNORMAL LOW (ref 135–145)
Total Protein: 6.3 g/dL (ref 6.0–8.3)

## 2012-10-28 LAB — CBC WITH DIFFERENTIAL/PLATELET
Eosinophils Absolute: 0.2 10*3/uL (ref 0.0–0.7)
Lymphocytes Relative: 29 % (ref 12–46)
Lymphs Abs: 3.8 10*3/uL (ref 0.7–4.0)
MCH: 32.1 pg (ref 26.0–34.0)
Neutrophils Relative %: 65 % (ref 43–77)
Platelets: 233 10*3/uL (ref 150–400)
RBC: 3.8 MIL/uL — ABNORMAL LOW (ref 3.87–5.11)
WBC: 13.1 10*3/uL — ABNORMAL HIGH (ref 4.0–10.5)

## 2012-10-28 MED ORDER — SODIUM CHLORIDE 0.9 % IV BOLUS (SEPSIS): 1000.0000 mL | Freq: Once | INTRAVENOUS | Status: DC

## 2012-10-28 MED ORDER — ONDANSETRON HCL 4 MG/2ML IJ SOLN: 4.0000 mg | Freq: Once | INTRAMUSCULAR | Status: DC

## 2012-10-28 MED ORDER — SODIUM CHLORIDE 0.9 % IV BOLUS (SEPSIS)
1000.0000 mL | Freq: Once | INTRAVENOUS | Status: AC
  Administered 2012-10-28: 1000 mL via INTRAVENOUS

## 2012-10-28 MED ORDER — METOCLOPRAMIDE HCL 5 MG/ML IJ SOLN
10.0000 mg | Freq: Once | INTRAMUSCULAR | Status: AC
  Administered 2012-10-28: 10 mg via INTRAVENOUS
  Filled 2012-10-28 (×2): qty 2

## 2012-10-28 MED ORDER — SODIUM CHLORIDE 0.9 % IV SOLN
Freq: Once | INTRAVENOUS | Status: AC
  Administered 2012-10-28: 23:00:00 via INTRAVENOUS

## 2012-10-28 NOTE — ED Notes (Addendum)
Pt reports n/v x2 days, denies diarrhea - states she is now having abd pain secondary to vomiting. Pt denies any fever, states she recently had her appendix taken out last month. States she has not had a regular menstrual period "in a while." Pt vomiting while in triage - reports allergy to zofran.

## 2012-10-29 MED ORDER — METOCLOPRAMIDE HCL 10 MG PO TABS: 10.0000 mg | ORAL_TABLET | Freq: Four times a day (QID) | ORAL | Status: DC

## 2012-10-29 MED ORDER — PRENATAL VITAMINS 0.8 MG PO TABS: 1.0000 | ORAL_TABLET | Freq: Every day | ORAL | Status: DC

## 2012-10-29 NOTE — ED Provider Notes (Signed)
History     CSN: [id number]  Arrival date & time 10/28/12  2051   First MD Initiated Contact with Patient 10/28/12 2114      Chief Complaint  Patient presents with  . Nausea  . Emesis    (Consider location/radiation/quality/duration/timing/severity/associated sxs/prior treatment) HPI Pt presenting with nausea and vomiting x 2 days.  No abdominal pain.  No sick contacts.  No fever.  LMP 09/16/12.  No vaginal bleeding or discharge.  Pt is G4P2A1.  Emesis is nonbloody and nonbilious.  She states she has not been able to keep down liquids.  There are no other associated systemic symptoms, there are no other alleviating or modifying factors.   History reviewed. No pertinent past medical history.  Past Surgical History  Procedure Laterality Date  . Shoulder surgery    . Appendectomy      Family History  Problem Relation Age of Onset  . Cancer Other     History  Substance Use Topics  . Smoking status: Current Every Day Smoker -- 0.50 packs/day    Types: Cigarettes  . Smokeless tobacco: Not on file  . Alcohol Use: No    OB History   Grav Para Term Preterm Abortions TAB SAB Ect Mult Living   4 3              Review of Systems ROS reviewed and all otherwise negative except for mentioned in HPI  Allergies  Peanut-containing drug products; Ibuprofen; Zofran; Chocolate; and Prednisone  Home Medications   Current Outpatient Rx  Name  Route  Sig  Dispense  Refill  . metoCLOPramide (REGLAN) 10 MG tablet   Oral   Take 1 tablet (10 mg total) by mouth every 6 (six) hours.   10 tablet   0   . Prenatal Multivit-Min-Fe-FA (PRENATAL VITAMINS) 0.8 MG tablet   Oral   Take 1 tablet by mouth daily.   30 tablet   0     BP 104/58  Pulse 89  Temp(Src) 98.2 F (36.8 C) (Oral)  Resp 16  Ht 5\' 4"  (1.626 m)  Wt 128 lb 12.8 oz (58.423 kg)  BMI 22.1 kg/m2  SpO2 100%  LMP 09/16/2012 Vitals reviewed Physical Exam Physical Examination: General appearance - alert, well  appearing, and in no distress Mental status - alert, oriented to person, place, and time Eyes - no scleral icterus, no conjunctival injection Mouth - mucous membranes moist, pharynx normal without lesions Chest - clear to auscultation, no wheezes, rales or rhonchi, symmetric air entry Heart - normal rate, regular rhythm, normal S1, S2, no murmurs, rubs, clicks or gallops Abdomen - soft, nontender, nondistended, no masses or organomegaly, nabs Extremities - peripheral pulses normal, no pedal edema, no clubbing or cyanosis Skin - normal coloration and turgor, no rashes  ED Course  Procedures (including critical care time)  Labs Reviewed  CBC WITH DIFFERENTIAL - Abnormal; Notable for the following:    WBC 13.1 (*)    RBC 3.80 (*)    HCT 34.8 (*)    Neutro Abs 8.5 (*)    All other components within normal limits  COMPREHENSIVE METABOLIC PANEL - Abnormal; Notable for the following:    Sodium 134 (*)    Glucose, Bld 113 (*)    Total Bilirubin 0.1 (*)    All other components within normal limits  URINALYSIS, MICROSCOPIC ONLY - Abnormal; Notable for the following:    APPearance CLOUDY (*)    Leukocytes, UA SMALL (*)    Squamous  Epithelial / LPF MANY (*)    All other components within normal limits  HCG, QUANTITATIVE, PREGNANCY - Abnormal; Notable for the following:    hCG, Beta Chain, Quant, S 22926 (*)    All other components within normal limits  POCT PREGNANCY, URINE - Abnormal; Notable for the following:    Preg Test, Ur POSITIVE (*)    All other components within normal limits  LIPASE, BLOOD   No results found.   1. Nausea and vomiting in pregnancy       MDM  Pt presenting with nausea and vomiting.  Symptoms began acutely 2 days ago.  Urine pregnancy is positive.  Pt has no abdominal pain or tenderness, no vaginal bleeding.  Low suspicion for ectopic pregnancy.  Pt advised of results. She feels much improved after reglan and IV fluids.  She has allergy to zofran.  Pt to  arrange for follow up with her OB, Dr. Jacqulyn Ducking.  Discharged with strict return precautions.  Pt agreeable with plan.       Ethelda Chick, MD 10/29/12 1534

## 2012-10-29 NOTE — ED Notes (Signed)
Discharge instructions reviewed w/ pt., verbalizes understanding. Two prescriptions provided at discharge. 

## 2012-12-05 ENCOUNTER — Emergency Department: Payer: Self-pay | Admitting: Emergency Medicine

## 2012-12-05 LAB — URINALYSIS, COMPLETE
Bilirubin,UR: NEGATIVE
Blood: NEGATIVE
Glucose,UR: NEGATIVE mg/dL (ref 0–75)
Ketone: NEGATIVE
Nitrite: NEGATIVE
Ph: 7 (ref 4.5–8.0)
Protein: NEGATIVE
RBC,UR: <1 /HPF (ref 0–5)
Specific Gravity: 1.013 (ref 1.003–1.030)
Squamous Epithelial: 3
WBC UR: 2 /HPF (ref 0–5)

## 2012-12-05 LAB — COMPREHENSIVE METABOLIC PANEL
Albumin: 3.6 g/dL (ref 3.4–5.0)
Alkaline Phosphatase: 63 U/L (ref 50–136)
Anion Gap: 4 — ABNORMAL LOW (ref 7–16)
BUN: 7 mg/dL (ref 7–18)
Bilirubin,Total: 0.2 mg/dL (ref 0.2–1.0)
Calcium, Total: 9.1 mg/dL (ref 8.5–10.1)
Chloride: 105 mmol/L (ref 98–107)
Creatinine: 0.48 mg/dL — ABNORMAL LOW (ref 0.60–1.30)
EGFR (Non-African Amer.): >60
Glucose: 88 mg/dL (ref 65–99)
Osmolality: 269 (ref 275–301)

## 2012-12-05 LAB — CBC
HCT: 37.7 % (ref 35.0–47.0)
HGB: 12.7 g/dL (ref 12.0–16.0)
MCH: 32.3 pg (ref 26.0–34.0)
MCHC: 33.8 g/dL (ref 32.0–36.0)
MCV: 96 fL (ref 80–100)
Platelet: 264 x10 3/mm 3 (ref 150–440)
RBC: 3.93 X10 6/mm 3 (ref 3.80–5.20)
RDW: 14.3 % (ref 11.5–14.5)
WBC: 15.3 x10 3/mm 3 — ABNORMAL HIGH (ref 3.6–11.0)

## 2012-12-05 LAB — HCG, QUANTITATIVE, PREGNANCY: Beta Hcg, Quant.: 83849 m[IU]/mL — ABNORMAL HIGH

## 2012-12-09 ENCOUNTER — Emergency Department (HOSPITAL_COMMUNITY)
Admission: EM | Admit: 2012-12-09 | Discharge: 2012-12-09 | Disposition: A | Discharge status: Home / Self Care | Payer: Medicaid Other | Attending: Emergency Medicine | Admitting: Emergency Medicine

## 2012-12-09 ENCOUNTER — Encounter (HOSPITAL_COMMUNITY): Payer: Self-pay | Admitting: *Deleted

## 2012-12-09 ENCOUNTER — Emergency Department (HOSPITAL_COMMUNITY): Payer: Medicaid Other

## 2012-12-09 DIAGNOSIS — O99330 Smoking (tobacco) complicating pregnancy, unspecified trimester: Secondary | ICD-10-CM | POA: Insufficient documentation

## 2012-12-09 DIAGNOSIS — N898 Other specified noninflammatory disorders of vagina: Secondary | ICD-10-CM | POA: Insufficient documentation

## 2012-12-09 DIAGNOSIS — Z79899 Other long term (current) drug therapy: Secondary | ICD-10-CM | POA: Insufficient documentation

## 2012-12-09 DIAGNOSIS — O9989 Other specified diseases and conditions complicating pregnancy, childbirth and the puerperium: Secondary | ICD-10-CM | POA: Insufficient documentation

## 2012-12-09 DIAGNOSIS — O219 Vomiting of pregnancy, unspecified: Secondary | ICD-10-CM

## 2012-12-09 DIAGNOSIS — N949 Unspecified condition associated with female genital organs and menstrual cycle: Secondary | ICD-10-CM | POA: Insufficient documentation

## 2012-12-09 LAB — URINE MICROSCOPIC-ADD ON

## 2012-12-09 LAB — URINALYSIS, ROUTINE W REFLEX MICROSCOPIC
Glucose, UA: NEGATIVE mg/dL
Hgb urine dipstick: NEGATIVE
Ketones, ur: >80 mg/dL — AB
Protein, ur: NEGATIVE mg/dL
pH: 7 (ref 5.0–8.0)

## 2012-12-09 LAB — CBC
HCT: 36 % (ref 36.0–46.0)
Hemoglobin: 12.4 g/dL (ref 12.0–15.0)
MCHC: 34.4 g/dL (ref 30.0–36.0)
MCV: 93.3 fL (ref 78.0–100.0)
WBC: 14.5 10*3/uL — ABNORMAL HIGH (ref 4.0–10.5)

## 2012-12-09 LAB — COMPREHENSIVE METABOLIC PANEL
Alkaline Phosphatase: 59 U/L (ref 39–117)
BUN: 9 mg/dL (ref 6–23)
Chloride: 99 mEq/L (ref 96–112)
Creatinine, Ser: 0.57 mg/dL (ref 0.50–1.10)
GFR calc Af Amer: >90 mL/min (ref >90)
GFR calc non Af Amer: >90 mL/min (ref >90)
Glucose, Bld: 95 mg/dL (ref 70–99)
Potassium: 3.7 mEq/L (ref 3.5–5.1)
Total Bilirubin: 0.3 mg/dL (ref 0.3–1.2)

## 2012-12-09 MED ORDER — PROMETHAZINE HCL 25 MG/ML IJ SOLN
25.0000 mg | INTRAMUSCULAR | Status: AC
  Administered 2012-12-09: 25 mg via INTRAVENOUS
  Filled 2012-12-09: qty 1

## 2012-12-09 MED ORDER — PROMETHAZINE HCL 25 MG PO TABS: 25.0000 mg | ORAL_TABLET | Freq: Four times a day (QID) | ORAL | Status: DC | PRN

## 2012-12-09 MED ORDER — METOCLOPRAMIDE HCL 5 MG/ML IJ SOLN: 10.0000 mg | Freq: Once | INTRAMUSCULAR | Status: DC

## 2012-12-09 NOTE — ED Notes (Signed)
Pt refuses to get undressed to have pelvic exam, states she wants some medicine to make her stop vomiting and she will not do anything until she gets it. Patient says "You guys have promised me I was going to get medicine and its been over two hours, I wont do anything until I get some medicine!"

## 2012-12-09 NOTE — ED Notes (Signed)
Patient transported to Ultrasound 

## 2012-12-09 NOTE — ED Provider Notes (Signed)
History     CSN: [id number]  Arrival date & time 12/09/12  1047   First MD Initiated Contact with Patient 12/09/12 1113      Chief Complaint  Patient presents with  . Nausea  . Emesis    (Consider location/radiation/quality/duration/timing/severity/associated sxs/prior treatment) HPI Comments: This is a 27 year old female who presents today with sudden onset nausea and vomiting since last night. She is pregnant, but has not been to see her OB yet. She was diagnosed on her last visit to the ED on 2/24. Her OB appointment is Wednesday. She complains of pelvic pain which is non radiating and sharp. Vomiting has included stomach contents. No blood. No diarrhea. No vaginal discharge or bleeding. She ate hamburger helper last night. Appendectomy in January. No fever, chest pain, or shortness of breath.   The history is provided by the patient. No language interpreter was used.    No past medical history on file.  Past Surgical History  Procedure Laterality Date  . Shoulder surgery    . Appendectomy      Family History  Problem Relation Age of Onset  . Cancer Other     History  Substance Use Topics  . Smoking status: Current Every Day Smoker -- 0.50 packs/day    Types: Cigarettes  . Smokeless tobacco: Not on file  . Alcohol Use: No    OB History   Grav Para Term Preterm Abortions TAB SAB Ect Mult Living   4 3              Review of Systems  Constitutional: Negative for fever and chills.  Respiratory: Negative for chest tightness and shortness of breath.   Cardiovascular: Negative for chest pain.  Gastrointestinal: Positive for nausea and vomiting. Negative for diarrhea and abdominal distention.  Genitourinary: Positive for pelvic pain. Negative for vaginal bleeding and vaginal discharge.  All other systems reviewed and are negative.    Allergies  Peanut-containing drug products; Ibuprofen; Zofran; Chocolate; and Prednisone  Home Medications   Current Outpatient  Rx  Name  Route  Sig  Dispense  Refill  . metoCLOPramide (REGLAN) 10 MG tablet   Oral   Take 1 tablet (10 mg total) by mouth every 6 (six) hours.   10 tablet   0   . Prenatal Multivit-Min-Fe-FA (PRENATAL VITAMINS) 0.8 MG tablet   Oral   Take 1 tablet by mouth daily.   30 tablet   0     BP 99/59  Pulse 87  Temp(Src) 98 F (36.7 C) (Oral)  Resp 18  SpO2 100%  Physical Exam  Nursing note and vitals reviewed. Constitutional: She is oriented to person, place, and time. She appears well-developed and well-nourished. She appears distressed.  HENT:  Head: Normocephalic and atraumatic.  Right Ear: External ear normal.  Left Ear: External ear normal.  Nose: Nose normal.  Mouth/Throat: Oropharynx is clear and moist.  Eyes: Conjunctivae are normal.  Neck: Normal range of motion. No tracheal deviation present.  Cardiovascular: Normal rate, regular rhythm and normal heart sounds.   Pulmonary/Chest: Effort normal and breath sounds normal. No stridor. No respiratory distress. She has no wheezes. She has no rales.  Abdominal: Soft. Bowel sounds are normal. She exhibits no distension. There is no tenderness. There is no rebound and no guarding.  Genitourinary: Cervix exhibits motion tenderness. Cervix exhibits no friability. Vaginal discharge (white) found.  Musculoskeletal: Normal range of motion.  Neurological: She is alert and oriented to person, place, and time.  She has normal strength.  Skin: Skin is warm and dry. She is not diaphoretic. No erythema.  Psychiatric: She has a normal mood and affect. Her behavior is normal.    ED Course  Procedures (including critical care time)  Labs Reviewed  WET PREP, GENITAL - Abnormal; Notable for the following:    Clue Cells Wet Prep HPF POC RARE (*)    WBC, Wet Prep HPF POC RARE (*)    All other components within normal limits  URINALYSIS, ROUTINE W REFLEX MICROSCOPIC - Abnormal; Notable for the following:    Ketones, ur >80 (*)     Leukocytes, UA TRACE (*)    All other components within normal limits  CBC - Abnormal; Notable for the following:    WBC 14.5 (*)    RBC 3.86 (*)    All other components within normal limits  COMPREHENSIVE METABOLIC PANEL - Abnormal; Notable for the following:    Sodium 134 (*)    All other components within normal limits  URINE MICROSCOPIC-ADD ON - Abnormal; Notable for the following:    Bacteria, UA FEW (*)    All other components within normal limits  GC/CHLAMYDIA PROBE AMP   US Ob Comp Less 14 Wks  12/09/2012  *RADIOLOGY REPORT*  Clinical Data:  11 weeks 6 days gestational age by LMP with pelvic pain.  OBSTETRIC <14 WK [place] US DOPPLER ULTRASOUND OF OVARIES  Technique:  Transabdominal ultrasound exam was performed for complete evaluation of the gestation as well as the maternal uterus, adnexal regions, and pelvic cul-de-sac.  Color and duplex Doppler ultrasound was utilized to evaluate blood flow to the ovaries.  Comparison:  None.  Intrauterine gestational sac: Visualized/normal in shape. Yolk sac:  Not visualized Embryo:  Visualized Cardiac Activity:  Visualized Heart Rate:  158 bpm  CRL:  62    mm  12      w  4 d            [place] EDC:  06/19/13  Maternal uterus/adnexae: The ovaries are normal bilaterally.  Presumed physiologic herniation of bowel content noted, amenable to follow-up at routine fetal anatomic survey.  Pulsed Doppler evaluation demonstrates normal-appearing low resistance arterial and venous wave forms bilaterally.  IMPRESSION: Single live intrauterine gestation with concordant measurements compared to assigned gestational age by LMP.  Probable fetal physiologic bowel herniation.  A routine anatomic fetal survey is recommended at 18-[redacted] weeks gestational age.  No sonographic evidence for ovarian torsion.   Original Report Authenticated By: Christiana Pellant, M.D.    [place] Art/ven Flow Abd Pelv Doppler  12/09/2012  *RADIOLOGY REPORT*  Clinical Data:  11 weeks 6 days gestational age by LMP  with pelvic pain.  OBSTETRIC <14 WK [place] US DOPPLER ULTRASOUND OF OVARIES  Technique:  Transabdominal ultrasound exam was performed for complete evaluation of the gestation as well as the maternal uterus, adnexal regions, and pelvic cul-de-sac.  Color and duplex Doppler ultrasound was utilized to evaluate blood flow to the ovaries.  Comparison:  None.  Intrauterine gestational sac: Visualized/normal in shape. Yolk sac:  Not visualized Embryo:  Visualized Cardiac Activity:  Visualized Heart Rate:  158 bpm  CRL:  62    mm  12      w  4 d            [place] EDC:  06/19/13  Maternal uterus/adnexae: The ovaries are normal bilaterally.  Presumed physiologic herniation of bowel content noted, amenable to follow-up at routine fetal anatomic survey.  Pulsed Doppler evaluation demonstrates normal-appearing low resistance arterial and venous wave forms bilaterally.  IMPRESSION: Single live intrauterine gestation with concordant measurements compared to assigned gestational age by LMP.  Probable fetal physiologic bowel herniation.  A routine anatomic fetal survey is recommended at 18-[redacted] weeks gestational age.  No sonographic evidence for ovarian torsion.   Original Report Authenticated By: Christiana Pellant, M.D.      1. Nausea and vomiting in pregnancy       MDM  Patient presents today with nausea and vomiting that started suddenly last night. US showed IUP 11 weeks and 6 days. OB appointment Wednesday. CMT on pelvic exam. No strawberry cervix or friability. PID unlikely in pregnancy. Cultures pending at time of discharge. Rare clue cells found on wet prep. Because she has OB appointment in 2 days will let OB treat. GC/chlamydia swab pending. Will call with positive results. Discussed that phenergan was category C in pregnancy. Take as sparingly as possible. Patient states she is allergic to Zofran and Reglan. She does not want anything in these classes. Discussed this case with the attending and he agrees with plan. Strict  return instructions given. Patient / Family / Caregiver informed of clinical course, understand medical decision-making process, and agree with plan.       Mora Bellman, PA-C 12/09/12 2019

## 2012-12-09 NOTE — ED Notes (Signed)
abd pain nausea vomiting since last night not feeling well.

## 2012-12-09 NOTE — ED Notes (Signed)
PA at bedside.

## 2012-12-09 NOTE — ED Notes (Signed)
Patient stated she does not need to urinate, "I cant even drink anything how am I going to pee?"

## 2012-12-10 LAB — GC/CHLAMYDIA PROBE AMP
CT Probe RNA: NEGATIVE
GC Probe RNA: NEGATIVE

## 2012-12-10 NOTE — ED Provider Notes (Signed)
Medical screening examination/treatment/procedure(s) were performed by non-physician practitioner and as supervising physician I was immediately available for consultation/collaboration.    Celene Kras, MD 12/10/12 [phone]

## 2013-01-23 IMAGING — CR DG CHEST 2V
2 series · 2 of 2 positions shown · non-contrast
Comparison: 01/31/2006

CLINICAL DATA: Chest pain

CHEST - 2 VIEW

[w chest pa]
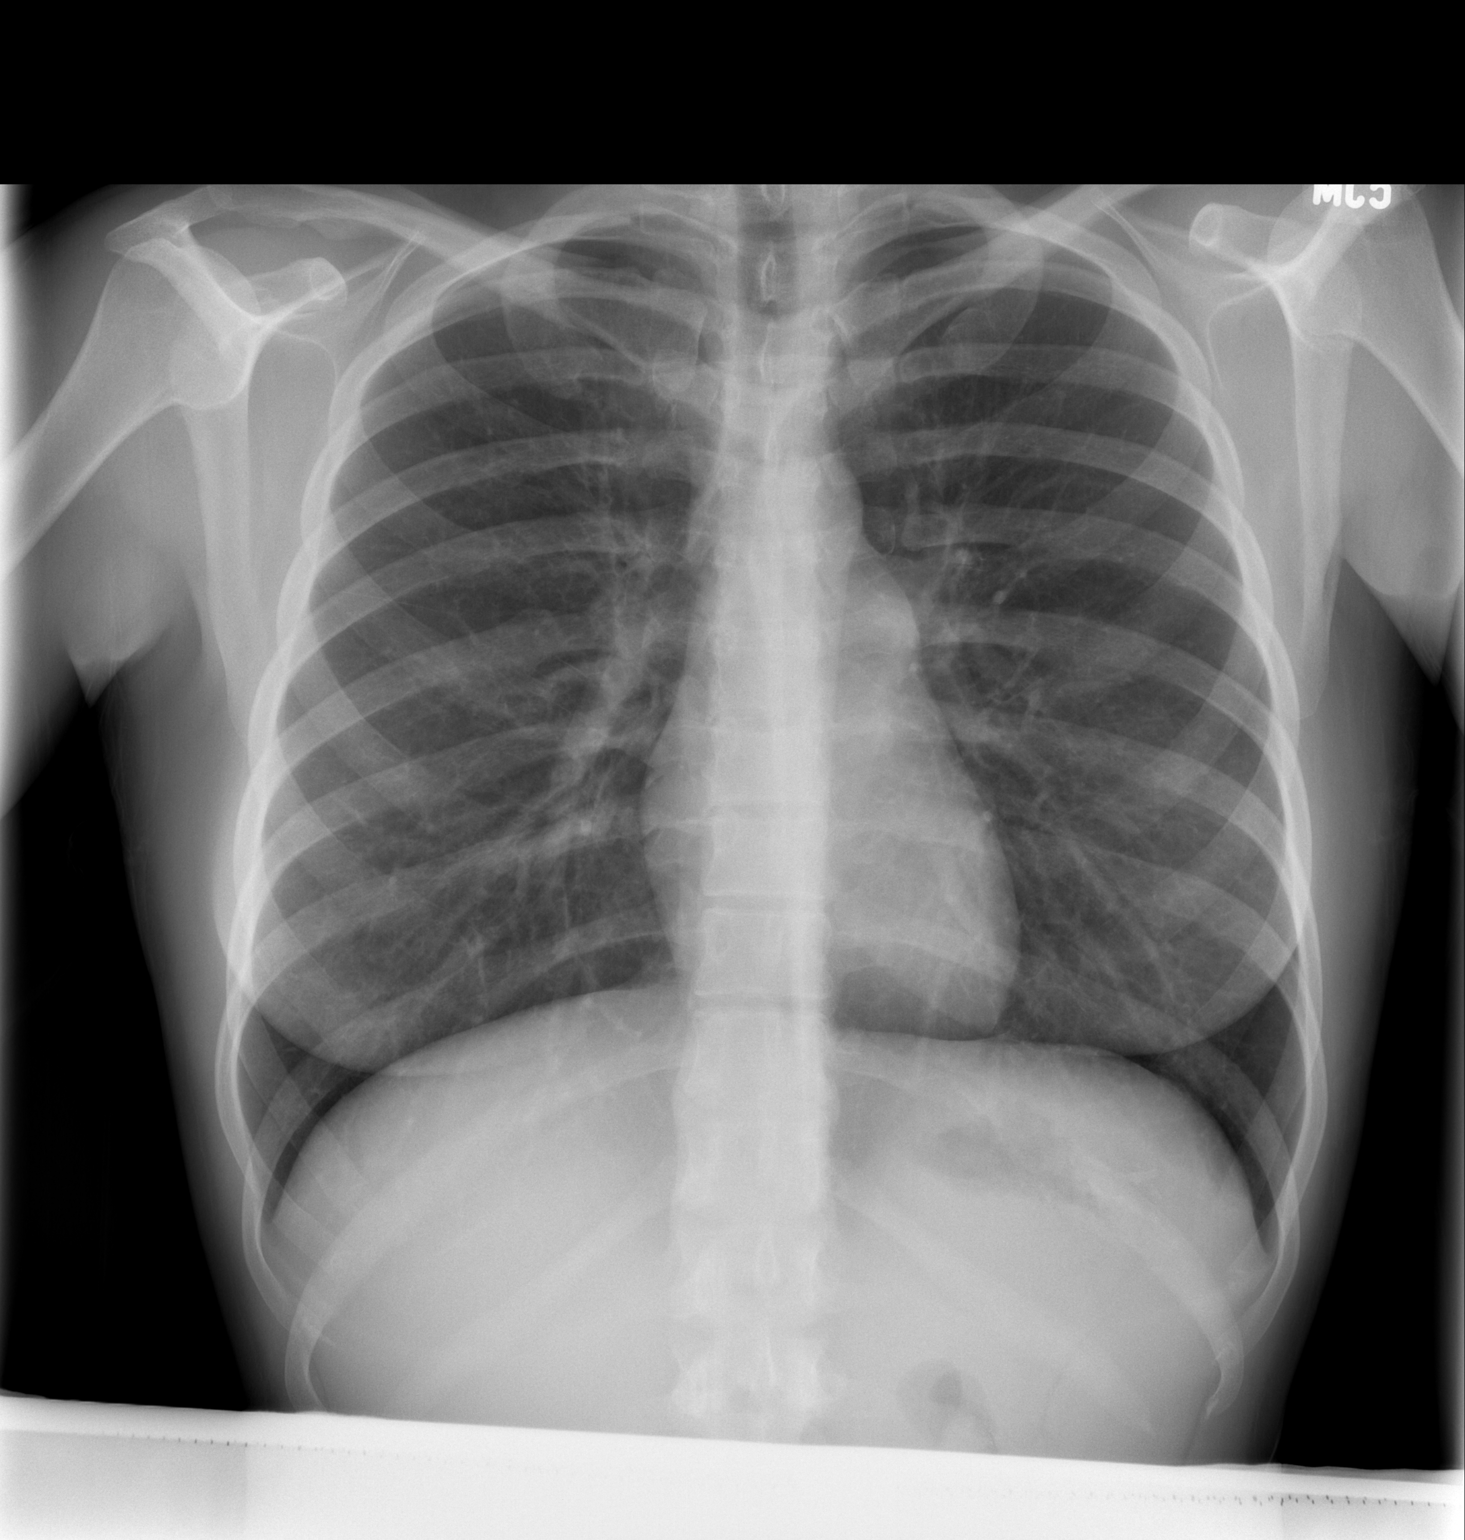

[w chest lat]
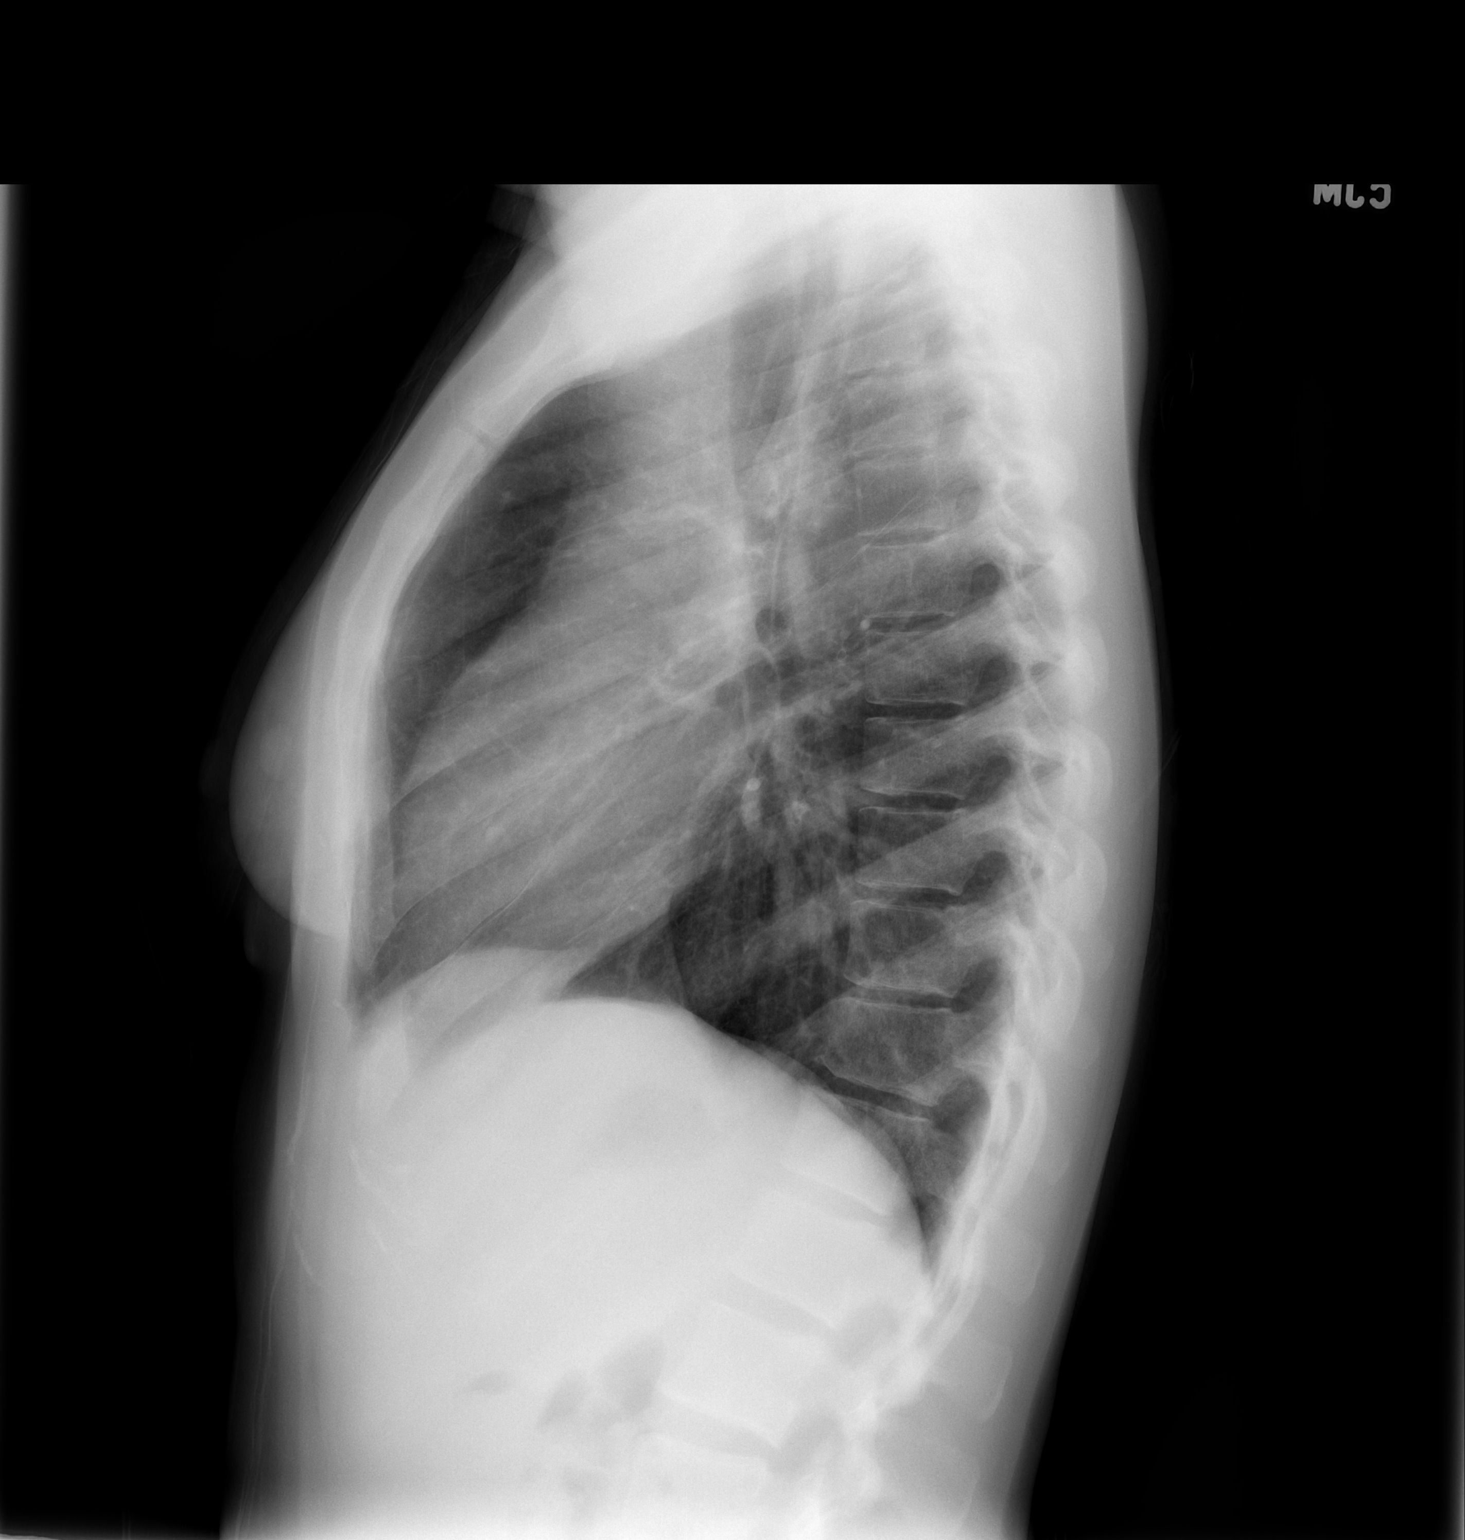

[2 of 2 positions shown; findings below may reference images not displayed]

FINDINGS: Cardiomediastinal silhouette is within normal limits. The
lungs are clear. No pleural effusion.  No pneumothorax.  No acute
osseous abnormality.
IMPRESSION: Normal chest.

## 2013-02-05 ENCOUNTER — Other Ambulatory Visit: Payer: Self-pay

## 2013-02-06 ENCOUNTER — Other Ambulatory Visit: Payer: Self-pay

## 2013-02-07 ENCOUNTER — Other Ambulatory Visit (HOSPITAL_COMMUNITY): Payer: Self-pay | Admitting: Obstetrics and Gynecology

## 2013-02-07 DIAGNOSIS — O359XX1 Maternal care for (suspected) fetal abnormality and damage, unspecified, fetus 1: Secondary | ICD-10-CM

## 2013-02-07 DIAGNOSIS — IMO0001 Reserved for inherently not codable concepts without codable children: Secondary | ICD-10-CM

## 2013-02-20 ENCOUNTER — Ambulatory Visit (HOSPITAL_COMMUNITY)
Admission: RE | Admit: 2013-02-20 | Discharge: 2013-02-20 | Disposition: A | Discharge status: Home / Self Care | Payer: Medicaid Other | Source: Ambulatory Visit | Attending: Obstetrics and Gynecology | Admitting: Obstetrics and Gynecology

## 2013-02-20 ENCOUNTER — Telehealth (HOSPITAL_COMMUNITY): Payer: Self-pay | Admitting: MS"

## 2013-02-20 ENCOUNTER — Encounter (HOSPITAL_COMMUNITY): Payer: Self-pay

## 2013-02-20 DIAGNOSIS — O359XX1 Maternal care for (suspected) fetal abnormality and damage, unspecified, fetus 1: Secondary | ICD-10-CM

## 2013-02-20 DIAGNOSIS — O358XX0 Maternal care for other (suspected) fetal abnormality and damage, not applicable or unspecified: Secondary | ICD-10-CM | POA: Insufficient documentation

## 2013-02-20 DIAGNOSIS — Z363 Encounter for antenatal screening for malformations: Secondary | ICD-10-CM | POA: Insufficient documentation

## 2013-02-20 DIAGNOSIS — IMO0001 Reserved for inherently not codable concepts without codable children: Secondary | ICD-10-CM

## 2013-02-20 DIAGNOSIS — Z1389 Encounter for screening for other disorder: Secondary | ICD-10-CM | POA: Insufficient documentation

## 2013-02-20 NOTE — Telephone Encounter (Signed)
Called patient to see if she would prefer to have prenatal consultation with [facility] pediatric surgery on the July appointment date (7/2) or in August. Discussed that it is typically the first Wednesday of the month when they schedule prenatal consultations. Patient asked when NICU tour could be done. I said it can be done prior to June 26, as they had requested, and can be on any day that works for the couple. The patient stated that they would prefer to do both peds surgery appointment and NICU tour on the same day, in which case they will plan for the August date. I discussed that I would call patient back with details of appointment times at a later date.   Clydie Braun Daryan Cagley 02/20/2013 2:45 PM

## 2013-02-20 NOTE — Progress Notes (Addendum)
Genetic Counseling  High-Risk Gestation Note  Appointment Date:  02/20/2013 Referred By: Lavina Hamman, MD Date of Birth:  17-Nov-1985 Partner:  Kayla Diaz    Pregnancy History: [id number] Estimated Date of Delivery: 06/23/13 Estimated Gestational Age: [redacted]w[redacted]d Attending: Particia Nearing, MD  I met with Kayla Diaz and her partner, [name], for genetic counseling because of the previous ultrasound finding of gastroschisis.  We began by reviewing the ultrasound in detail. 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  Complete ultrasound results reported separately.   We discussed that gastroschisis occurs in approximately 1 in 5,000-10,000 live births, more often in babies born to younger mothers. Smoking during pregnancy has also been associated with increased risk for gastroschisis.  Gastroschisis is considered an abdominal wall defect which is characterized by an opening to the right of the umbilicus through which the intestines and other visceral organs protrude.  This opening usually arises from incomplete closure of the lateral folds during the sixth week of gestation.  We discussed that as a consequence of the intestinal herniation, the unprotected bowel may be damaged and not function well after delivery.  We discussed that gastroschisis is typically not associated with chromosomal anomalies or other structural malformations with the exception of intestinal atresia.  The majority of cases of gastroschisis are thought to be sporadic and multifactorial in etiology, with a low risk of recurrence.  However, there have been reports of both autosomal recessive and dominant inheritance.    We discussed that surgery to place the intestines into the baby's abdominal cavity is typically performed shortly after birth, to minimize the risk of infection.  In addition, we discussed that prognosis is typically good, but depends on the size of the abdominal opening and  whether or not damage has occurred to the exposed organs.    We also spent some time discussing the plans for the rest of the pregnancy.  Since gastroschisis exposes the fetal intestines to the amniotic fluid during pregnancy, there is an increased risk for third trimester complications, such as bowel dilatation, decreased fetal growth and amniotic fluid volume, preterm delivery, and stillbirth.  For these reasons, we discussed the option of close surveillance during the third trimester. The couple plans to deliver at [facility] [place].  They understand that after delivery, their baby will be transferred to [facility] for surgery.  We discussed the option of meeting with a pediatric surgeon, prior to delivery, to discuss  postnatal management and surgical correction.  This couple expressed interest in meeting with a Careers adviser.  We also discussed the option of a NICU tour at [facility] to become familiar with the location and care provided there.  They were interested in this as well.  We will coordinate these appointments for this patient.  Both family histories were reviewed and found to be noncontributory for birth defects, mental retardation, and known genetic conditions. Kayla Diaz reported that her mother had ovarian cancer, treated with surgery only. She also reported that her maternal grandmother, maternal great-aunt, and maternal great-grandmother have a history of breast cancer. Though most cancers are thought to be sporadic or due to environmental factors, some families appear to have a strong predisposition to cancers.  When considering a family history of cancer, we look for common types of cancer in multiple family members occurring at younger than typical ages. we discussed the option of meeting with a cancer genetic counselor to discuss any possible screening or testing options available. If  the patient or her family members are concerned about the  family history of cancer and would like to learn more about the family's chance for an inherited cancer syndrome, his doctor may refer her or her relatives to [facility] [phone]). Kayla Diaz stated that her physician is aware of this family history and that she is being screened and followed appropriately.  Without further information regarding the provided family history, an accurate genetic risk cannot be calculated.  Further genetic counseling is warranted if more information is obtained.  Kayla Diaz was provided with information regarding cystic fibrosis (CF) including the carrier frequency and incidence in the Caucasian population, the availability of carrier testing and prenatal diagnosis if indicated.  In addition, we discussed that CF is routinely screened for as part of the [place] newborn screening panel.  She declined testing today.   Kayla Diaz denied exposure to environmental toxins or chemical agents. She denied the use of alcohol or street drugs. She reported smoking cigarettes previously in the pregnancy but reported that she discontinued smoking approximately a month and a half ago. The associations of smoking were reviewed and cessation encouraged, as the patient reported she has done. She denied significant viral illnesses during the course of her pregnancy. Her medical and surgical histories were noncontributory.   I counseled this couple regarding the above risks and available options.  The approximate face-to-face time with the genetic counselor was 40 minutes.  Quinn Plowman, MS Certified Genetic Counselor 02/20/2013

## 2013-03-27 ENCOUNTER — Telehealth (HOSPITAL_COMMUNITY): Payer: Self-pay | Admitting: MS"

## 2013-03-27 NOTE — Telephone Encounter (Signed)
Called [name] regarding scheduling prenatal consult with pediatric surgeon at [facility]. Discussed that pediatric surgeon will be at [facility] on the morning on 8/20 to do prenatal consultations. That date will be approximately 3 weeks after the patient's scheduled ultrasound, so would likely do a follow-up ultrasound on that same date. Additionally, we can arrange for tour of NICU at [facility] for that same day, if that is more convenient for the patient. She stated that those appointments would work for her on 8/20, and she would like to tour NICU that same day. Discussed that the exact appointment times may depend upon what is available on ultrasound schedule but would be that morning of 8/20. I planned to call patient back with exact appointment times.   [name] 03/27/2013 4:45 PM

## 2013-04-01 ENCOUNTER — Other Ambulatory Visit (HOSPITAL_COMMUNITY): Payer: Self-pay | Admitting: Obstetrics and Gynecology

## 2013-04-01 DIAGNOSIS — O358XX0 Maternal care for other (suspected) fetal abnormality and damage, not applicable or unspecified: Secondary | ICD-10-CM

## 2013-04-03 ENCOUNTER — Ambulatory Visit (HOSPITAL_COMMUNITY)
Admission: RE | Admit: 2013-04-03 | Discharge: 2013-04-03 | Disposition: A | Discharge status: Home / Self Care | Payer: Medicaid Other | Source: Ambulatory Visit | Attending: Obstetrics and Gynecology | Admitting: Obstetrics and Gynecology

## 2013-04-03 VITALS — BP 113/66 | HR 100 | Wt 161.5 lb

## 2013-04-03 DIAGNOSIS — O358XX0 Maternal care for other (suspected) fetal abnormality and damage, not applicable or unspecified: Secondary | ICD-10-CM | POA: Insufficient documentation

## 2013-04-03 NOTE — Progress Notes (Signed)
Kayla Diaz  was seen today for an ultrasound appointment.  See full report in [facility]-OB/GYN.  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  Recommendations: Patient is scheduled for Peds surgery consultation on 8/20.  Will have ultrasound at [facility] at that time. Recommend follow up here in 6 weeks Recommend antepartum fetal testing beginning at 32 weeks (2x weekly NSTs with weekly AFIs)  Plans to delivery at [facility] hospital with neonatal transfer to [facility] after delivery- may have vaginal delivery; C-section for obstetric indications only  Alpha Gula, MD

## 2013-04-04 ENCOUNTER — Telehealth (HOSPITAL_COMMUNITY): Payer: Self-pay | Admitting: MS"

## 2013-04-04 NOTE — Telephone Encounter (Signed)
Called patient regarding option of NICU tour at [facility]. Can schedule this on 8/20, same date that she is in [place] for follow-up ultrasound and to meet with pediatric surgeon. Those appointments are at 9:30 and 10:30. Asked patient if she would like to do NICU tour at 12:00 or a little later, in case she wanted to get lunch in between appointments. She asked if can schedule tour for 1:00. Told her that should not be a problem. Instructed patient that she will meet Fulton Mole, who will give her the tour, at the main entrance to [facility] at the [place] stand. Her car will be parked for free. Patient was encouraged to call back with additional questions or concerns.   Clydie Braun Nelia Rogoff 04/04/2013 10:37 AM

## 2013-04-25 ENCOUNTER — Telehealth (HOSPITAL_COMMUNITY): Payer: Self-pay | Admitting: MS"

## 2013-04-25 NOTE — Telephone Encounter (Signed)
Ms. MAKARIA POARCH called to say that after her appointments yesterday at [facility] and tour of NICU at [facility] as well as discussion with Dr. Jackelyn Knife at her OB visit this morning, she would like to transfer her care to [facility]). Patient stated that she would like to go ahead and transfer care now to [facility]. She signed the release this morning at her OB office for records from there to be sent. Discussed with the patient that we can help set up those appointments for her. Somebody from our office or [facility] will contact her with the appointment dates and times once they are set up.   Clydie Braun Jevaeh Shams 04/25/2013 10:39 AM

## 2013-05-01 DIAGNOSIS — O35DXX0 Maternal care for other (suspected) fetal abnormality and damage, fetal gastrointestinal anomalies, not applicable or unspecified: Secondary | ICD-10-CM | POA: Insufficient documentation

## 2013-05-01 DIAGNOSIS — A6009 Herpesviral infection of other urogenital tract: Secondary | ICD-10-CM | POA: Insufficient documentation

## 2013-05-08 ENCOUNTER — Other Ambulatory Visit (HOSPITAL_COMMUNITY): Payer: Medicaid Other

## 2013-05-09 ENCOUNTER — Ambulatory Visit (HOSPITAL_COMMUNITY)
Admission: RE | Admit: 2013-05-09 | Discharge: 2013-05-09 | Disposition: A | Discharge status: Home / Self Care | Payer: Medicaid Other | Source: Ambulatory Visit | Attending: Obstetrics and Gynecology | Admitting: Obstetrics and Gynecology

## 2013-05-09 ENCOUNTER — Encounter (HOSPITAL_COMMUNITY): Payer: Self-pay

## 2013-05-09 DIAGNOSIS — Z363 Encounter for antenatal screening for malformations: Secondary | ICD-10-CM | POA: Insufficient documentation

## 2013-05-09 DIAGNOSIS — Z1389 Encounter for screening for other disorder: Secondary | ICD-10-CM | POA: Insufficient documentation

## 2013-05-09 DIAGNOSIS — O358XX0 Maternal care for other (suspected) fetal abnormality and damage, not applicable or unspecified: Secondary | ICD-10-CM | POA: Insufficient documentation

## 2013-05-13 ENCOUNTER — Ambulatory Visit (HOSPITAL_COMMUNITY)
Admission: RE | Admit: 2013-05-13 | Discharge: 2013-05-13 | Disposition: A | Discharge status: Home / Self Care | Payer: Medicaid Other | Source: Ambulatory Visit | Attending: Maternal and Fetal Medicine | Admitting: Maternal and Fetal Medicine

## 2013-05-13 ENCOUNTER — Encounter (HOSPITAL_COMMUNITY): Payer: Self-pay

## 2013-05-13 DIAGNOSIS — O358XX0 Maternal care for other (suspected) fetal abnormality and damage, not applicable or unspecified: Secondary | ICD-10-CM | POA: Insufficient documentation

## 2013-05-14 ENCOUNTER — Other Ambulatory Visit (HOSPITAL_COMMUNITY): Payer: Self-pay | Admitting: Maternal and Fetal Medicine

## 2013-05-14 DIAGNOSIS — O358XX0 Maternal care for other (suspected) fetal abnormality and damage, not applicable or unspecified: Secondary | ICD-10-CM

## 2013-05-15 ENCOUNTER — Ambulatory Visit (HOSPITAL_COMMUNITY): Payer: Medicaid Other

## 2013-05-16 ENCOUNTER — Ambulatory Visit (HOSPITAL_COMMUNITY)
Admission: RE | Admit: 2013-05-16 | Discharge: 2013-05-16 | Disposition: A | Discharge status: Home / Self Care | Payer: Medicaid Other | Source: Ambulatory Visit | Attending: Maternal and Fetal Medicine | Admitting: Maternal and Fetal Medicine

## 2013-05-16 DIAGNOSIS — O358XX0 Maternal care for other (suspected) fetal abnormality and damage, not applicable or unspecified: Secondary | ICD-10-CM | POA: Insufficient documentation

## 2013-05-16 NOTE — Progress Notes (Signed)
Ms. Kayla Diaz was seen for ultrasound appointment today.  Please see [facility]-OBGYN report for details.

## 2013-05-18 ENCOUNTER — Inpatient Hospital Stay (HOSPITAL_COMMUNITY)
Admission: AD | Admit: 2013-05-18 | Discharge: 2013-05-18 | Disposition: A | Discharge status: Home / Self Care | Payer: Medicaid Other | Source: Ambulatory Visit | Attending: Obstetrics & Gynecology | Admitting: Obstetrics & Gynecology

## 2013-05-18 ENCOUNTER — Encounter (HOSPITAL_COMMUNITY): Payer: Self-pay | Admitting: *Deleted

## 2013-05-18 DIAGNOSIS — O358XX0 Maternal care for other (suspected) fetal abnormality and damage, not applicable or unspecified: Secondary | ICD-10-CM | POA: Insufficient documentation

## 2013-05-18 DIAGNOSIS — O26859 Spotting complicating pregnancy, unspecified trimester: Secondary | ICD-10-CM | POA: Insufficient documentation

## 2013-05-18 DIAGNOSIS — O4693 Antepartum hemorrhage, unspecified, third trimester: Secondary | ICD-10-CM

## 2013-05-18 DIAGNOSIS — O4690 Antepartum hemorrhage, unspecified, unspecified trimester: Secondary | ICD-10-CM

## 2013-05-18 DIAGNOSIS — R109 Unspecified abdominal pain: Secondary | ICD-10-CM | POA: Insufficient documentation

## 2013-05-18 LAB — URINALYSIS, ROUTINE W REFLEX MICROSCOPIC
Nitrite: NEGATIVE
Specific Gravity, Urine: >1.03 — ABNORMAL HIGH (ref 1.005–1.030)
Urobilinogen, UA: 0.2 mg/dL (ref 0.0–1.0)

## 2013-05-18 LAB — URINE MICROSCOPIC-ADD ON

## 2013-05-18 NOTE — MAU Provider Note (Signed)
Attestation of Attending Supervision of Advanced Practitioner (PA/CNM/NP): Evaluation and management procedures were performed by the Advanced Practitioner under my supervision and collaboration.  I have reviewed the Advanced Practitioner's note and chart, and I agree with the management and plan.  Grettel Rames, MD, FACOG Attending Obstetrician & Gynecologist Faculty Practice, [facility]   

## 2013-05-18 NOTE — MAU Note (Signed)
K Booker in a delivery, unable to come to MAU @ this time. Requests for MAU provider to see patient.

## 2013-05-18 NOTE — MAU Note (Signed)
Pt reports pelvic pressure since 10am and spotting since about lunch time. Pt deneis any recent intercourse. Schedule for NST and [place] on Tuesday.

## 2013-05-18 NOTE — MAU Provider Note (Signed)
  History     CSN: [id number]  Arrival date and time: 05/18/13 0103   None     Chief Complaint  Patient presents with  . Vaginal Bleeding  . Abdominal Pain   HPI  Pt is a G5P3013 at [redacted]w[redacted]d weeks IUP here with report of spotting of blood throughout the day.  Spotting is described as light.  No report of abdominal pain or contractions.  +fetal movement.  Also reports pelvic pressure.  Receives prenatal care in [place] due to fetus having gastroschisis.  Plans to deliver at [facility].    History reviewed. No pertinent past medical history.  Past Surgical History  Procedure Laterality Date  . Shoulder surgery    . Appendectomy      Family History  Problem Relation Age of Onset  . Cancer Other     History  Substance Use Topics  . Smoking status: Former Smoker -- 0.50 packs/day    Types: Cigarettes  . Smokeless tobacco: Never Used  . Alcohol Use: No    Allergies:  Allergies  Allergen Reactions  . Peanut-Containing Drug Products Shortness Of Breath, Itching and Swelling  . Ibuprofen [Ibuprofen]     Causes stomach pain  . Zofran Hives and Itching  . Chocolate Swelling and Rash  . Prednisone Rash    Prescriptions prior to admission  Medication Sig Dispense Refill  . Ferrous Sulfate (IRON SUPPLEMENT PO) Take by mouth 1 day or 1 dose.      . metoCLOPramide (REGLAN) 10 MG tablet Take 1 tablet (10 mg total) by mouth every 6 (six) hours.  10 tablet  0  . omeprazole (PRILOSEC) 40 MG capsule Take 40 mg by mouth daily.      . Prenatal Multivit-Min-Fe-FA (PRENATAL VITAMINS) 0.8 MG tablet Take 1 tablet by mouth daily.  30 tablet  0  . promethazine (PHENERGAN) 25 MG suppository Place 1 suppository (25 mg total) rectally every 6 (six) hours as needed for nausea.  12 each  0  . promethazine (PHENERGAN) 25 MG tablet Take 1 tablet (25 mg total) by mouth every 6 (six) hours as needed for nausea.  12 tablet  0    Review of Systems  Gastrointestinal: Positive for abdominal pain  (pelvic pressure).  Genitourinary:       Spotting of blood  All other systems reviewed and are negative.   Physical Exam   Blood pressure 114/67, pulse 111, temperature 98 F (36.7 C), temperature source Oral, resp. rate 18, last menstrual period 09/16/2012.  Physical Exam  Constitutional: She is oriented to person, place, and time. She appears well-developed and well-nourished. No distress.  HENT:  Head: Normocephalic.  Neck: Normal range of motion. Neck supple.  Cardiovascular: Normal rate, regular rhythm and normal heart sounds.   Respiratory: Effort normal and breath sounds normal.  GI: Soft. There is no tenderness.  Genitourinary: No bleeding around the vagina. No vaginal discharge found.    Neurological: She is alert and oriented to person, place, and time.  Skin: Skin is warm and dry.   Dilation: Closed Effacement (%): Thick Exam by:: W Muhammad  FHR 140's, +accels, reactive Toco - none  MAU Course  Procedures  Assessment and Plan  27 yo G5P3013 at [redacted]w[redacted]d wks - Spotting in Pregnancy (Exam normal) Category I FHR Tracing Fetal Gastroschisis  Plan: Discharge to home Provided reassurance Reviewed with patient the need to go to [facility] for active labor - pt verbalized understanding Bleeding precautions [facility] 05/18/2013, 1:53 AM

## 2013-05-19 ENCOUNTER — Ambulatory Visit (HOSPITAL_COMMUNITY): Payer: Medicaid Other

## 2013-05-20 ENCOUNTER — Ambulatory Visit (HOSPITAL_COMMUNITY): Payer: Medicaid Other

## 2013-05-20 LAB — URINE CULTURE: Colony Count: 50000

## 2013-05-21 ENCOUNTER — Other Ambulatory Visit: Payer: Self-pay | Admitting: Medical

## 2013-05-21 DIAGNOSIS — B952 Enterococcus as the cause of diseases classified elsewhere: Secondary | ICD-10-CM

## 2013-05-21 MED ORDER — NITROFURANTOIN MONOHYD MACRO 100 MG PO CAPS: 100.0000 mg | ORAL_CAPSULE | Freq: Two times a day (BID) | ORAL | Status: DC

## 2013-05-21 NOTE — Progress Notes (Signed)
Rx for Macrobid sent to patient's pharmacy for UTI. Patient contact number has no VM option and emergency contact number has been disconnected. Patient has appointment with MFM this week. Will ask that they notify the patient.   Freddi Starr, PA-C 05/21/2013 9:15 AM

## 2013-05-23 ENCOUNTER — Ambulatory Visit (HOSPITAL_COMMUNITY)
Admission: RE | Admit: 2013-05-23 | Discharge: 2013-05-23 | Disposition: A | Discharge status: Home / Self Care | Payer: Medicaid Other | Source: Ambulatory Visit | Attending: Obstetrics and Gynecology | Admitting: Obstetrics and Gynecology

## 2013-05-23 DIAGNOSIS — O358XX0 Maternal care for other (suspected) fetal abnormality and damage, not applicable or unspecified: Secondary | ICD-10-CM | POA: Insufficient documentation

## 2013-05-23 NOTE — ED Notes (Signed)
Pt informed of UTI from mau visit and Rx at pharmacy.

## 2013-05-30 ENCOUNTER — Ambulatory Visit (HOSPITAL_COMMUNITY)
Admission: RE | Admit: 2013-05-30 | Discharge: 2013-05-30 | Disposition: A | Discharge status: Home / Self Care | Payer: Medicaid Other | Source: Ambulatory Visit | Attending: Maternal and Fetal Medicine | Admitting: Maternal and Fetal Medicine

## 2013-05-30 DIAGNOSIS — O358XX0 Maternal care for other (suspected) fetal abnormality and damage, not applicable or unspecified: Secondary | ICD-10-CM | POA: Insufficient documentation

## 2013-06-06 ENCOUNTER — Ambulatory Visit (HOSPITAL_COMMUNITY)
Admission: RE | Admit: 2013-06-06 | Discharge: 2013-06-06 | Disposition: A | Discharge status: Home / Self Care | Payer: Medicaid Other | Source: Ambulatory Visit | Attending: Maternal and Fetal Medicine | Admitting: Maternal and Fetal Medicine

## 2013-06-06 ENCOUNTER — Encounter (HOSPITAL_COMMUNITY): Payer: Self-pay

## 2013-06-06 DIAGNOSIS — O3660X0 Maternal care for excessive fetal growth, unspecified trimester, not applicable or unspecified: Secondary | ICD-10-CM | POA: Insufficient documentation

## 2013-06-06 DIAGNOSIS — E669 Obesity, unspecified: Secondary | ICD-10-CM | POA: Insufficient documentation

## 2013-06-06 DIAGNOSIS — O99810 Abnormal glucose complicating pregnancy: Secondary | ICD-10-CM | POA: Insufficient documentation

## 2013-06-06 DIAGNOSIS — O34219 Maternal care for unspecified type scar from previous cesarean delivery: Secondary | ICD-10-CM | POA: Insufficient documentation

## 2013-06-13 ENCOUNTER — Ambulatory Visit (HOSPITAL_COMMUNITY): Payer: Medicaid Other

## 2013-10-24 ENCOUNTER — Encounter (HOSPITAL_COMMUNITY): Payer: Self-pay | Admitting: Emergency Medicine

## 2013-10-24 ENCOUNTER — Emergency Department (HOSPITAL_COMMUNITY)
Admission: EM | Admit: 2013-10-24 | Discharge: 2013-10-24 | Disposition: A | Discharge status: Home / Self Care | Payer: Medicaid Other | Attending: Emergency Medicine | Admitting: Emergency Medicine

## 2013-10-24 DIAGNOSIS — Z792 Long term (current) use of antibiotics: Secondary | ICD-10-CM | POA: Insufficient documentation

## 2013-10-24 DIAGNOSIS — Z87891 Personal history of nicotine dependence: Secondary | ICD-10-CM | POA: Insufficient documentation

## 2013-10-24 DIAGNOSIS — S1093XA Contusion of unspecified part of neck, initial encounter: Secondary | ICD-10-CM

## 2013-10-24 DIAGNOSIS — R7309 Other abnormal glucose: Secondary | ICD-10-CM | POA: Insufficient documentation

## 2013-10-24 DIAGNOSIS — Z79899 Other long term (current) drug therapy: Secondary | ICD-10-CM | POA: Insufficient documentation

## 2013-10-24 DIAGNOSIS — Y9289 Other specified places as the place of occurrence of the external cause: Secondary | ICD-10-CM | POA: Insufficient documentation

## 2013-10-24 DIAGNOSIS — Y939 Activity, unspecified: Secondary | ICD-10-CM | POA: Insufficient documentation

## 2013-10-24 DIAGNOSIS — S0083XA Contusion of other part of head, initial encounter: Secondary | ICD-10-CM

## 2013-10-24 DIAGNOSIS — Z3202 Encounter for pregnancy test, result negative: Secondary | ICD-10-CM | POA: Insufficient documentation

## 2013-10-24 DIAGNOSIS — R739 Hyperglycemia, unspecified: Secondary | ICD-10-CM

## 2013-10-24 DIAGNOSIS — W2209XA Striking against other stationary object, initial encounter: Secondary | ICD-10-CM | POA: Insufficient documentation

## 2013-10-24 DIAGNOSIS — S0003XA Contusion of scalp, initial encounter: Secondary | ICD-10-CM | POA: Insufficient documentation

## 2013-10-24 LAB — CBC WITH DIFFERENTIAL/PLATELET
BASOS ABS: 0 10*3/uL (ref 0.0–0.1)
BASOS PCT: 0 % (ref 0–1)
EOS ABS: 0 10*3/uL (ref 0.0–0.7)
Eosinophils Relative: 0 % (ref 0–5)
HCT: 41.4 % (ref 36.0–46.0)
HEMOGLOBIN: 14.2 g/dL (ref 12.0–15.0)
Lymphocytes Relative: 7 % — ABNORMAL LOW (ref 12–46)
Lymphs Abs: 1 10*3/uL (ref 0.7–4.0)
MCH: 30.5 pg (ref 26.0–34.0)
MCHC: 34.3 g/dL (ref 30.0–36.0)
MCV: 89 fL (ref 78.0–100.0)
Monocytes Absolute: 0.4 10*3/uL (ref 0.1–1.0)
Monocytes Relative: 3 % (ref 3–12)
NEUTROS ABS: 13.4 10*3/uL — AB (ref 1.7–7.7)
NEUTROS PCT: 90 % — AB (ref 43–77)
PLATELETS: 281 10*3/uL (ref 150–400)
RBC: 4.65 MIL/uL (ref 3.87–5.11)
RDW: 15.3 % (ref 11.5–15.5)
WBC: 14.8 10*3/uL — ABNORMAL HIGH (ref 4.0–10.5)

## 2013-10-24 LAB — COMPREHENSIVE METABOLIC PANEL
ALT: 49 U/L — AB (ref 0–35)
AST: 27 U/L (ref 0–37)
Albumin: 4.6 g/dL (ref 3.5–5.2)
Alkaline Phosphatase: 88 U/L (ref 39–117)
BILIRUBIN TOTAL: 0.3 mg/dL (ref 0.3–1.2)
BUN: 13 mg/dL (ref 6–23)
CALCIUM: 9.5 mg/dL (ref 8.4–10.5)
CHLORIDE: 101 meq/L (ref 96–112)
CO2: 24 meq/L (ref 19–32)
CREATININE: 0.7 mg/dL (ref 0.50–1.10)
GLUCOSE: 147 mg/dL — AB (ref 70–99)
Potassium: 4 mEq/L (ref 3.7–5.3)
SODIUM: 139 meq/L (ref 137–147)
Total Protein: 8 g/dL (ref 6.0–8.3)

## 2013-10-24 LAB — RAPID URINE DRUG SCREEN, HOSP PERFORMED
Amphetamines: NOT DETECTED
Barbiturates: NOT DETECTED
Benzodiazepines: NOT DETECTED
COCAINE: NOT DETECTED
OPIATES: NOT DETECTED
Tetrahydrocannabinol: POSITIVE — AB

## 2013-10-24 LAB — PREGNANCY, URINE: Preg Test, Ur: NEGATIVE
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
## 2013-10-24 NOTE — BH Assessment (Signed)
Clinician contacted [facility] to speak to doctor prior to assessment. Dee[facility] Ethelda ChickJacubowitz. IVC was reversed by doctor Jacubowitz.  Tele-assessment was cancelled.    Kayla Diaz, MSW, LCSW Triage Specialist [phone]

## 2013-10-24 NOTE — Progress Notes (Signed)
   CARE MANAGEMENT ED NOTE 10/24/2013  Patient:  Kayla Diaz,Kayla T   Account Number:  [id number]  Date Initiated:  10/24/2013  Documentation initiated by:  Edd ArbourGIBBS,Alexie Lanni  Subjective/Objective Assessment:   28 yr [place] pt brought in by [employer]uty.  Deputy called d/t IVC papers taken out by ex boyfriend.  Ex Boyfriend stated patient sent picture to him of her on a bridge.  York SpanielSaid pt was saying she was going to jump.     Subjective/Objective Assessment Detail:   Confirmed with ED CM she has no medicaid pcp     Action/Plan:   Cm [place] [employer] [place]   Action/Plan Detail:   Anticipated DC Date:       Status Recommendation to Physician:   Result of Recommendation:    Other ED Services  Consult Working Plan    DC Planning Services  Other  PCP issues  Outpatient Services - Pt will follow up    Choice offered to / List presented to:            Status of service:  Completed, signed off  ED Comments:   ED Comments Detail:

## 2013-10-24 NOTE — ED Notes (Signed)
Pt Mother and Father are here and would like to speak with Dr. JShela Commons

## 2013-10-24 NOTE — Discharge Instructions (Signed)
Glucose, Blood Sugar, Fasting Blood Sugar Blood sugar today was elevated at 147. [facility] within the next week to be checked for diabetes. If you have any thought of harming yourself or others call 911 immediately This is a test to measure your blood sugar. Glucose is a simple sugar that serves as the main source of energy for the body. The carbohydrates we eat are broken down into glucose (and a few other simple sugars), absorbed by the small intestine, and circulated throughout the body. Most of the body's cells require glucose for energy production; brain and nervous system cells not only rely on glucose for energy, they can only function when glucose levels in the blood remain above a certain level.  The body's use of glucose hinges on the availability of insulin, a hormone produced by the pancreas. Insulin acts as a Engineer, maintenancetraffic director, transporting glucose into the body's cells, directing the body to store excess glucose as glycogen (for short-term storage) and/or as triglycerides in fat cells. We can not live without glucose or insulin, and they must be in balance.  Normally, blood glucose levels rise slightly after a meal, and insulin is secreted to lower them, with the amount of insulin released matched up with the size and content of the meal. If blood glucose levels drop too low, such as might occur in between meals or after a strenuous workout, glucagon (another pancreatic hormone) is secreted to tell the liver to turn some glycogen back into glucose, raising the blood glucose levels. If the glucose/insulin feedback mechanism is working properly, the amount of glucose in the blood remains fairly stable. If the balance is disrupted and glucose levels in the blood rise, then the body tries to restore the balance, both by increasing insulin production and by excreting glucose in the urine.  PREPARATION FOR TEST A blood sample drawn from a vein in your arm or, for a  self check, a drop of blood from a skin prick; in general, it may be recommended that you fast before having a blood glucose test; sometimes a random (no preparation) urine sample is used. Your caregiver will instruct you as to what they want prior to your testing. NORMAL FINDINGS Normal values depend on many factors. Your lab will provide a range of normal values with your test results. The following information summarizes the meaning of the test results. These are based on the clinical practice recommendations of the American Diabetes Association.  FASTING BLOOD GLUCOSE  From 70 to 99 mg/dL (3.9 to 5.5 mmol/L): Normal glucose tolerance  From 100 to 125 mg/dL (5.6 to 6.9 mmol/L):Impaired fasting glucose (pre-diabetes)  126 mg/dL (7.0 mmol/L) and above on more than one testing occasion: Diabetes ORAL GLUCOSE TOLERANCE TEST (OGTT) [EXCEPT PREGNANCY] (2 HOURS AFTER A 75-GRAM GLUCOSE DRINK)  Less than 140 mg/dL (7.8 mmol/L): Normal glucose tolerance  From 140 to 200 mg/dL (7.8 to 16.111.1 mmol/L): Impaired glucose tolerance (pre-diabetes)  Over 200 mg/dL (09.611.1 mmol/L) on more than one testing occasion: Diabetes GESTATIONAL DIABETES SCREENING: GLUCOSE CHALLENGE TEST (1 HOUR AFTER A 50-GRAM GLUCOSE DRINK)  Less than 140* mg/dL (7.8 mmol/L): Normal glucose tolerance  140* mg/dL (7.8 mmol/L) and over: Abnormal, needs OGTT (see below) * Some use a cutoff of More Than 130 mg/dL (7.2 mmol/L) because that identifies 90% of women with gestational diabetes, compared to 80% identified using the threshold of More Than 140 mg/dL (7.8 mmol/L). GESTATIONAL DIABETES DIAGNOSTIC: OGTT (100-GRAM GLUCOSE DRINK)  Fasting*..........................................95 mg/dL (  5.3 mmol/L)  1 hour after glucose load*..............180 mg/dL (16.1 mmol/L)  2 hours after glucose load*.............155 mg/dL (8.6 mmol/L)  3 hours after glucose load* **.........140 mg/dL (7.8 mmol/L) * If two or more values are above the  criteria, gestational diabetes is diagnosed. ** A 75-gram glucose load may be used, although this method is not as well validated as the 100-gram OGTT; the 3-hour sample is not drawn if 75 grams is used.  Ranges for normal findings may vary among different laboratories and hospitals. You should always check with your doctor after having lab work or other tests done to discuss the meaning of your test results and whether your values are considered within normal limits. MEANING OF TEST  Your caregiver will go over the test results with you and discuss the importance and meaning of your results, as well as treatment options and the need for additional tests if necessary. OBTAINING THE TEST RESULTS It is your responsibility to obtain your test results. Ask the lab or department performing the test when and how you will get your results. Document Released: 09/22/2004 Document Revised: 11/13/2011 Document Reviewed: 08/01/2008 [facility] Patient Information 2014 [place], [place].

## 2013-10-24 NOTE — ED Notes (Signed)
Bed: WA15 Expected date:  Expected time:  Means of arrival:  Comments: EMS  

## 2013-10-24 NOTE — ED Provider Notes (Signed)
CSN: [id number]     Arrival date & time 10/24/13  1516 History   First MD Initiated Contact with Patient 10/24/13 1522     No chief complaint on file.  chief complaint involuntary commitment   (Consider location/radiation/quality/duration/timing/severity/associated sxs/prior Treatment) HPI Patient is here under involuntary psychiatric commitment taken out by her boyfriend stating patient presents and suicidal. Extended her boyfriend a picture of her standing on the ledge of a bridge stating today was her last day. She was also found hitting herself in the face according to commitment papers boyfriend is concerned about her safety and the safety of her 56-month-old child. Patient denies suicidal ideation stating that "my boyfriend is trying to get back to me "she states concern for her child and the need to get back to her child stating that she is breast-feeding presently. Denies suicidal or homicidal ideation. She states she received a bruise on her head from inside of the police car when she was frustrated and banged her head against the inside of the police car. No past medical history on file. Past Surgical History  Procedure Laterality Date  . Shoulder surgery    . Appendectomy     saw psychiatrist 89 after death of father. No other psychiatric history Family History  Problem Relation Age of Onset  . Cancer Other    History  Substance Use Topics  . Smoking status: Former Smoker -- 0.50 packs/day    Types: Cigarettes  . Smokeless tobacco: Never Used  . Alcohol Use: No   positive marijuana use OB History   Grav Para Term Preterm Abortions TAB SAB Ect Mult Living   5 3 3  0 1 0 1 0 0 3     Review of Systems  Constitutional: Negative.   HENT: Negative.   Respiratory: Negative.   Cardiovascular: Negative.   Gastrointestinal: Negative.   Musculoskeletal: Negative.   Skin: Positive for wound.       Hematoma at forhead  Neurological: Negative.   Psychiatric/Behavioral: Negative.    All other systems reviewed and are negative.      Allergies  Peanut-containing drug products; Ibuprofen; Zofran; Chocolate; and Prednisone  Home Medications   Current Outpatient Rx  Name  Route  Sig  Dispense  Refill  . Ferrous Sulfate (IRON SUPPLEMENT PO)   Oral   Take by mouth 1 day or 1 dose.         . metoCLOPramide (REGLAN) 10 MG tablet   Oral   Take 1 tablet (10 mg total) by mouth every 6 (six) hours.   10 tablet   0   . nitrofurantoin, macrocrystal-monohydrate, (MACROBID) 100 MG capsule   Oral   Take 1 capsule (100 mg total) by mouth 2 (two) times daily.   14 capsule   0   . omeprazole (PRILOSEC) 40 MG capsule   Oral   Take 40 mg by mouth daily.         . Prenatal Multivit-Min-Fe-FA (PRENATAL VITAMINS) 0.8 MG tablet   Oral   Take 1 tablet by mouth daily.   30 tablet   0   . EXPIRED: promethazine (PHENERGAN) 25 MG suppository   Rectal   Place 1 suppository (25 mg total) rectally every 6 (six) hours as needed for nausea.   12 each   0   . promethazine (PHENERGAN) 25 MG tablet   Oral   Take 1 tablet (25 mg total) by mouth every 6 (six) hours as needed for nausea.   12 tablet  0    LMP 09/16/2012 Physical Exam  Nursing note and vitals reviewed. Constitutional: She is oriented to person, place, and time. She appears well-developed and well-nourished.  HENT:  Right Ear: External ear normal.  Left Ear: External ear normal.  Golf ball sized hematoma at center of fore head.  Eyes: Conjunctivae are normal. Pupils are equal, round, and reactive to light.  Neck: Neck supple. No tracheal deviation present. No thyromegaly present.  Cardiovascular: Normal rate and regular rhythm.   No murmur heard. Pulmonary/Chest: Effort normal and breath sounds normal.  Abdominal: Soft. Bowel sounds are normal. She exhibits no distension. There is no tenderness.  Musculoskeletal: Normal range of motion. She exhibits no edema and no tenderness.  Entire spine  nontender  Neurological: She is alert and oriented to person, place, and time. No cranial nerve deficit. Coordination normal.  Gait normal  Skin: Skin is warm and dry. No rash noted.  Psychiatric: She has a normal mood and affect.    ED Course  Procedures (including critical care time) Labs Review Labs Reviewed - No data to display Imaging Review No results found.  EKG Interpretation   None      Results for orders placed during the hospital encounter of 10/24/13  COMPREHENSIVE METABOLIC PANEL      Result Value Ref Range   Sodium 139  137 - 147 mEq/L   Potassium 4.0  3.7 - 5.3 mEq/L   Chloride 101  96 - 112 mEq/L   CO2 24  19 - 32 mEq/L   Glucose, Bld 147 (*) 70 - 99 mg/dL   BUN 13  6 - 23 mg/dL   Creatinine, Ser 8.410.70  0.50 - 1.10 mg/dL   Calcium 9.5  8.4 - 32.410.5 mg/dL   Total Protein 8.0  6.0 - 8.3 g/dL   Albumin 4.6  3.5 - 5.2 g/dL   AST 27  0 - 37 U/L   ALT 49 (*) 0 - 35 U/L   Alkaline Phosphatase 88  39 - 117 U/L   Total Bilirubin 0.3  0.3 - 1.2 mg/dL   GFR calc non Af Amer >90  >90 mL/min   GFR calc Af Amer >90  >90 mL/min  CBC WITH DIFFERENTIAL      Result Value Ref Range   WBC 14.8 (*) 4.0 - 10.5 K/uL   RBC 4.65  3.87 - 5.11 MIL/uL   Hemoglobin 14.2  12.0 - 15.0 g/dL   HCT 40.141.4  02.736.0 - 25.346.0 %   MCV 89.0  78.0 - 100.0 fL   MCH 30.5  26.0 - 34.0 pg   MCHC 34.3  30.0 - 36.0 g/dL   RDW 66.415.3  40.311.5 - 47.415.5 %   Platelets 281  150 - 400 K/uL   Neutrophils Relative % 90 (*) 43 - 77 %   Neutro Abs 13.4 (*) 1.7 - 7.7 K/uL   Lymphocytes Relative 7 (*) 12 - 46 %   Lymphs Abs 1.0  0.7 - 4.0 K/uL   Monocytes Relative 3  3 - 12 %   Monocytes Absolute 0.4  0.1 - 1.0 K/uL   Eosinophils Relative 0  0 - 5 %   Eosinophils Absolute 0.0  0.0 - 0.7 K/uL   Basophils Relative 0  0 - 1 %   Basophils Absolute 0.0  0.0 - 0.1 K/uL  PREGNANCY, URINE      Result Value Ref Range   Preg Test, Ur NEGATIVE  NEGATIVE  URINE RAPID DRUG SCREEN (HOSP  PERFORMED)      Result Value Ref Range    Opiates NONE DETECTED  NONE DETECTED   Cocaine NONE DETECTED  NONE DETECTED   Benzodiazepines NONE DETECTED  NONE DETECTED   Amphetamines NONE DETECTED  NONE DETECTED   Tetrahydrocannabinol POSITIVE (*) NONE DETECTED   Barbiturates NONE DETECTED  NONE DETECTED   No results found.  MDM   Final diagnoses:  None   TTS consult obtained. Patient gives permission to speak with surrogate father who she has lived with intermittently for the past 8 years. 615 pmSocial worker and I extensively interviewed patient surrogate father Marya Amsler hen he arrived. He states the patient has had no depression. Her boyfriend has been highly manipulative. He does not believe her to be depressed. Patient does not have depressed affect here. She will go home with Mr. Abelino Derrick . She will contact police about getting her child back from her boyfriend's residence. Involuntary psychiatric commitment reversed by me. She is instructed to follow up at her primary care md's office ([place] ) to be checked for diabetes. Dx #1 facial contusion #2 substance abuse #3 hyperglycemia    Doug Sou, MD 10/24/13 1610

## 2013-10-24 NOTE — BH Assessment (Signed)
Assessment Note  Kayla Diaz is an 28 y.o. female. Patient presents under IVC after argument with boyfriend. Patient states that her boyfriend IVC'd her so that he could clear his things out of the house. Patient denies SI,HI,AVH. Patient states that her children are her world and that she would not do anything to hurt herself. Patient is concerned about her daughter because she breast feeds her every 4 hours and has not been away from her for this long before. Spoke with patient's surrogate mother of 6 years Babette Relic( Tammy TurbevilleMedley) for collateral information. She states that the patient has not presented with any depressive symptoms. She feels that the patient has made a complete turn around in her life since the birth of her 194 month old daughter and that she has been an excellent mother. States that her and her boyfriend have a rocky relationship. States that she and her husband would be willing to drive to the hospital and take patient to stay with them for as long as she needs.  Patient does not meet criteria for inpatient stabilization.  Axis I: Deferred Axis II: Deferred Axis III: History reviewed. No pertinent past medical history. Axis IV: problems with primary support group Axis V: 61-70 mild symptoms  Past Medical History: History reviewed. No pertinent past medical history.  Past Surgical History  Procedure Laterality Date  . Shoulder surgery    . Appendectomy      Family History:  Family History  Problem Relation Age of Onset  . Cancer Other     Social History:  reports that she has been smoking Cigarettes.  She has been smoking about 1.00 pack per day. She has never used smokeless tobacco. She reports that she uses illicit drugs (Marijuana). She reports that she does not drink alcohol.  Additional Social History:  Alcohol / Drug Use Pain Medications: Na Prescriptions: NA Over the Counter: NA History of alcohol / drug use?: Yes Withdrawal Symptoms: Patient aware of  relationship between substance abuse and physical/medical complications Substance #1 Name of Substance 1: THC 1 - Age of First Use: 22 1 - Amount (size/oz): (1) joint 1 - Frequency: sporatic/ few times per month 1 - Duration: recreational for years 1 - Last Use / Amount: last weekend for birthday  CIWA: CIWA-Ar BP: 119/72 mmHg Pulse Rate: 96 COWS:    Allergies:  Allergies  Allergen Reactions  . Peanut-Containing Drug Products Shortness Of Breath, Itching and Swelling  . Ibuprofen [Ibuprofen]     Causes stomach pain  . Zofran Hives and Itching  . Chocolate Swelling and Rash  . Prednisone Rash    Home Medications:  (Not in a hospital admission)  OB/GYN Status:  Patient's last menstrual period was 09/16/2012.  General Assessment Data Location of Assessment: [facility] ED Is this a Tele or Face-to-Face Assessment?: Face-to-Face Is this an Initial Assessment or a Re-assessment for this encounter?: Initial Assessment Living Arrangements: Children (4 moth old daughter/ 94 yo son) Can pt return to current living arrangement?: Yes Admission Status: Involuntary Is patient capable of signing voluntary admission?: Yes Transfer from: Home Referral Source: MD  Medical Screening Exam [facility]) Medical Exam completed: Yes  [facility] Living Arrangements: Children (4 moth old daughter/ 404 yo son) Name of Psychiatrist: Na Name of Therapist: Na  Education Status Is patient currently in school?: No Current Grade: Na Highest grade of school patient has completed: GED Name of school: Na Contact person: Tammy Medley/ mother figure  Risk to  self Suicidal Ideation: No Suicidal Intent: No Is patient at risk for suicide?: No Suicidal Plan?: No Access to Means: No What has been your use of drugs/alcohol within the last 12 months?: Recreational Previous Attempts/Gestures: No How many times?:  (Na) Other Self Harm Risks:  (Na) Triggers for Past Attempts: None  known Intentional Self Injurious Behavior: None Family Suicide History: No Recent stressful life event(s):  (None noted) Persecutory voices/beliefs?: No Depression: No Depression Symptoms:  (None noted) Substance abuse history and/or treatment for substance abuse?: No Suicide prevention information given to non-admitted patients: Yes  Risk to Others Homicidal Ideation: No Thoughts of Harm to Others: No Current Homicidal Intent: No Current Homicidal Plan: No Access to Homicidal Means: No Identified Victim: Na History of harm to others?: No Assessment of Violence: None Noted Violent Behavior Description: Na Does patient have access to weapons?: No Criminal Charges Pending?: No Does patient have a court date: No  Psychosis Hallucinations: None noted Delusions: None noted  Mental Status Report Appear/Hygiene:  (WNL) Eye Contact: Good Motor Activity: Freedom of movement;Unremarkable Speech: Logical/coherent Level of Consciousness: Alert Mood: Anxious Affect: Appropriate to circumstance Anxiety Level: Minimal Thought Processes: Coherent;Relevant Judgement: Unimpaired Orientation: Person;Place;Time;Situation Obsessive Compulsive Thoughts/Behaviors: None  Cognitive Functioning Concentration: Normal Memory: Recent Intact;Remote Intact IQ: Average Insight: Good Impulse Control: Good Appetite: Good Weight Loss:  (None noted) Weight Gain:  (None noted) Sleep: No Change Total Hours of Sleep:  (4-6 hours/ night) Vegetative Symptoms: None  ADLScreening [facility] Assessment Services) Patient's cognitive ability adequate to safely complete daily activities?: Yes Patient able to express need for assistance with ADLs?: Yes Independently performs ADLs?: Yes (appropriate for developmental age)  Prior Inpatient Therapy Prior Inpatient Therapy: Yes Prior Therapy Dates: age 10yo Prior Therapy Facilty/Provider(s): [facility] Reason for Treatment: HI towards mother  Prior Outpatient  Therapy Prior Outpatient Therapy: No Prior Therapy Dates: Na Prior Therapy Facilty/Provider(s): Na Reason for Treatment: Na  ADL Screening (condition at time of admission) Patient's cognitive ability adequate to safely complete daily activities?: Yes Is the patient deaf or have difficulty hearing?: No Does the patient have difficulty seeing, even when wearing glasses/contacts?: No Does the patient have difficulty concentrating, remembering, or making decisions?: No Patient able to express need for assistance with ADLs?: Yes Does the patient have difficulty dressing or bathing?: No Independently performs ADLs?: Yes (appropriate for developmental age) Does the patient have difficulty walking or climbing stairs?: No Weakness of Legs: None Weakness of Arms/Hands: None  Home Assistive Devices/Equipment Home Assistive Devices/Equipment: None  Therapy Consults (therapy consults require a physician order) PT Evaluation Needed: No OT Evalulation Needed: No SLP Evaluation Needed: No Abuse/Neglect Assessment (Assessment to be complete while patient is alone) Physical Abuse: Denies Verbal Abuse: Denies Sexual Abuse: Denies Exploitation of patient/patient's resources: Denies Self-Neglect: Denies Values / Beliefs Cultural Requests During Hospitalization: None Spiritual Requests During Hospitalization: None Consults Spiritual Care Consult Needed: No Social Work Consult Needed: No Merchant navy officer (For Healthcare) Advance Directive: Patient does not have advance directive;Patient would not like information    Additional Information 1:1 In Past 12 Months?: No CIRT Risk: No Elopement Risk: No Does patient have medical clearance?: Yes     Disposition:  Disposition Initial Assessment Completed for this Encounter: Yes Disposition of Patient: Other dispositions (reversal of commitment) Other disposition(s): Information only;Other (Comment) (Reversal ofcommitment /DC with support  system and safety pla)  On Site Evaluation by: Dione Booze  Reviewed with Physician:    Rudi Coco 10/24/2013 7:58 PM

## 2013-10-24 NOTE — ED Notes (Signed)
Per DR. Shela CommonsJ, pt psych order notified.  Pt does not have to dress down in scrubs at this time.

## 2014-07-06 ENCOUNTER — Encounter (HOSPITAL_COMMUNITY): Payer: Self-pay | Admitting: Emergency Medicine

## 2014-10-24 ENCOUNTER — Encounter (HOSPITAL_COMMUNITY): Payer: Self-pay | Admitting: Emergency Medicine

## 2014-10-24 ENCOUNTER — Emergency Department (HOSPITAL_COMMUNITY)
Admission: EM | Admit: 2014-10-24 | Discharge: 2014-10-24 | Disposition: A | Discharge status: Home / Self Care | Payer: Medicaid Other | Attending: Emergency Medicine | Admitting: Emergency Medicine

## 2014-10-24 ENCOUNTER — Emergency Department (HOSPITAL_COMMUNITY): Payer: Medicaid Other

## 2014-10-24 DIAGNOSIS — Z79899 Other long term (current) drug therapy: Secondary | ICD-10-CM | POA: Insufficient documentation

## 2014-10-24 DIAGNOSIS — R059 Cough, unspecified: Secondary | ICD-10-CM

## 2014-10-24 DIAGNOSIS — M791 Myalgia: Secondary | ICD-10-CM | POA: Insufficient documentation

## 2014-10-24 DIAGNOSIS — J209 Acute bronchitis, unspecified: Secondary | ICD-10-CM

## 2014-10-24 DIAGNOSIS — R05 Cough: Secondary | ICD-10-CM

## 2014-10-24 DIAGNOSIS — Z7982 Long term (current) use of aspirin: Secondary | ICD-10-CM | POA: Diagnosis not present

## 2014-10-24 DIAGNOSIS — Z720 Tobacco use: Secondary | ICD-10-CM | POA: Diagnosis not present

## 2014-10-24 MED ORDER — ALBUTEROL SULFATE HFA 108 (90 BASE) MCG/ACT IN AERS
2.0000 | INHALATION_SPRAY | Freq: Once | RESPIRATORY_TRACT | Status: AC
  Administered 2014-10-24: 2 via RESPIRATORY_TRACT
  Filled 2014-10-24: qty 6.7

## 2014-10-24 MED ORDER — BENZONATATE 100 MG PO CAPS: 100.0000 mg | ORAL_CAPSULE | Freq: Three times a day (TID) | ORAL | Status: DC

## 2014-10-24 MED ORDER — HYDROCODONE-HOMATROPINE 5-1.5 MG/5ML PO SYRP
5.0000 mL | ORAL_SOLUTION | Freq: Once | ORAL | Status: AC
  Administered 2014-10-24: 5 mL via ORAL
  Filled 2014-10-24: qty 5

## 2014-10-24 MED ORDER — HYDROCODONE-HOMATROPINE 5-1.5 MG/5ML PO SYRP: 5.0000 mL | ORAL_SOLUTION | Freq: Three times a day (TID) | ORAL | Status: DC | PRN

## 2014-10-24 NOTE — ED Notes (Signed)
Pt states she has had a cough for a week. Pt states cough has gotten extremely worse over the past 2 or 3 days.Cough is non productive.

## 2014-10-24 NOTE — ED Provider Notes (Signed)
CSN: [id number]     Arrival date & time 10/24/14  0045 History   First MD Initiated Contact with Patient 10/24/14 0118     Chief Complaint  Patient presents with  . Cough    (Consider location/radiation/quality/duration/timing/severity/associated sxs/prior Treatment) Patient is a 29 y.o. female presenting with cough. The history is provided by the patient. No language interpreter was used.  Cough Cough characteristics:  Non-productive and dry Severity:  Moderate Onset quality:  Gradual Duration:  1 week (worsening x 3 days) Timing:  Intermittent Progression:  Worsening Chronicity:  New Smoker: yes   Context: upper respiratory infection   Context: not sick contacts   Relieved by:  Nothing Ineffective treatments:  Home nebulizer and rest Associated symptoms: myalgias, rhinorrhea and sinus congestion   Associated symptoms: no fever, no shortness of breath, no sore throat and no wheezing     History reviewed. No pertinent past medical history. Past Surgical History  Procedure Laterality Date  . Shoulder surgery    . Appendectomy     Family History  Problem Relation Age of Onset  . Cancer Other    History  Substance Use Topics  . Smoking status: Current Every Day Smoker -- 1.00 packs/day    Types: Cigarettes  . Smokeless tobacco: Never Used  . Alcohol Use: No   OB History    Gravida Para Term Preterm AB TAB SAB Ectopic Multiple Living   0 1 0 1 0 0 3      Review of Systems  Constitutional: Negative for fever.  HENT: Positive for congestion and rhinorrhea. Negative for sore throat.   Respiratory: Positive for cough. Negative for shortness of breath and wheezing.   Gastrointestinal: Negative for vomiting.  Musculoskeletal: Positive for myalgias.  Neurological: Negative for syncope and light-headedness.  All other systems reviewed and are negative.   Allergies  Peanut-containing drug products; Ibuprofen; Zofran; Chocolate; and Prednisone  Home Medications    Prior to Admission medications   Medication Sig Start Date End Date Taking? Authorizing Provider  aspirin-sod bicarb-citric acid (ALKA-SELTZER) 325 MG TBEF tablet Take 325 mg by mouth every 6 (six) hours as needed (flu-like symptoms).    Historical Provider, MD  benzonatate (TESSALON) 100 MG capsule Take 1 capsule (100 mg total) by mouth every 8 (eight) hours. 10/24/14   Antony Madura, PA-C  HYDROcodone-homatropine (HYCODAN) 5-1.5 MG/5ML syrup Take 5 mLs by mouth every 8 (eight) hours as needed for cough. 10/24/14   Antony Madura, PA-C  Phenylephrine-Pheniramine-DM [facility] COLD & COUGH) 06-24-19 MG PACK Take 1 Package by mouth daily as needed (flu-like symptoms).    Historical Provider, MD  Prenatal Multivit-Min-Fe-FA (PRENATAL VITAMINS) 0.8 MG tablet Take 1 tablet by mouth daily. Patient not taking: Reported on 10/24/2014 10/29/12   Ethelda Chick, MD  promethazine (PHENERGAN) 25 MG suppository Place 1 suppository (25 mg total) rectally every 6 (six) hours as needed for nausea. 11/21/11 11/28/11  April K Palumbo-Rasch, MD   BP 132/82 mmHg  Pulse 105  Temp(Src) 98.2 F (36.8 C) (Oral)  Resp 22  SpO2 98%  LMP 10/05/2014   Physical Exam  Constitutional: She is oriented to person, place, and time. She appears well-developed and well-nourished. No distress.  Nontoxic/nonseptic appearing  HENT:  Head: Normocephalic and atraumatic.  Eyes: Conjunctivae and EOM are normal. No scleral icterus.  Neck: Normal range of motion.  No stridor  Cardiovascular: Normal rate, regular rhythm and normal heart sounds.   Pulmonary/Chest: Effort normal and breath sounds normal.  No respiratory distress. She has no wheezes. She has no rales.  Lungs clear bilaterally. Chest expansion symmetric. Patient has a dry, nonproductive cough appreciated at bedside.  Musculoskeletal: Normal range of motion.  Neurological: She is alert and oriented to person, place, and time. She exhibits normal muscle tone. Coordination  normal.  GCS 15. Patient speaks in full goal oriented sentences. She moves extremities without ataxia.  Skin: Skin is warm and dry. No rash noted. She is not diaphoretic. No erythema. No pallor.  Psychiatric: She has a normal mood and affect. Her behavior is normal.  Nursing note and vitals reviewed.   ED Course  Procedures (including critical care time) Labs Review Labs Reviewed - No data to display  Imaging Review Dg Chest 2 View  10/24/2014   CLINICAL DATA:  Cough and congestion for 1 week  EXAM: CHEST  2 VIEW  COMPARISON:  12/28/2011  FINDINGS: The heart size and mediastinal contours are within normal limits. Both lungs are clear. The visualized skeletal structures are unremarkable.  IMPRESSION: No active cardiopulmonary disease.   Electronically Signed   By: Ellery Plunkaniel R Mitchell M.D.   On: 10/24/2014 01:30     EKG Interpretation None      MDM   Final diagnoses:  Cough  Acute bronchitis, unspecified organism    Pt CXR negative for acute infiltrate. Patients symptoms are consistent with URI, likely viral etiology. Discussed that antibiotics are not indicated for viral infections. Pt will be discharged with symptomatic treatment.  Patient verbalizes understanding and is agreeable with plan. Pt is hemodynamically stable and in NAD prior to discharge. Return precautions given.   Filed Vitals:   10/24/14 0055  BP: 132/82  Pulse: 105  Temp: 98.2 F (36.8 C)  TempSrc: Oral  Resp: 22  SpO2: 98%      Antony MaduraKelly Reshunda Strider, PA-C 10/24/14 0232  Layla MawKristen N Ward, DO 10/24/14 [phone]

## 2014-10-24 NOTE — Discharge Instructions (Signed)
Recommend an albuterol inhaler, 2 puffs every 4 hours, as needed for cough and shortness of breath as well as wheezing. Take Tessalon as prescribed. You may take Hycodan as needed for persisting cough. Follow-up with a primary doctor for recheck of symptoms in one week.  Cough, Adult  A cough is a reflex that helps clear your throat and airways. It can help heal the body or may be a reaction to an irritated airway. A cough may only last 2 or 3 weeks (acute) or may last more than 8 weeks (chronic).  CAUSES Acute cough:  Viral or bacterial infections. Chronic cough:  Infections.  Allergies.  Asthma.  Post-nasal drip.  Smoking.  Heartburn or acid reflux.  Some medicines.  Chronic lung problems (COPD).  Cancer. SYMPTOMS   Cough.  Fever.  Chest pain.  Increased breathing rate.  High-pitched whistling sound when breathing (wheezing).  Colored mucus that you cough up (sputum). TREATMENT   A bacterial cough may be treated with antibiotic medicine.  A viral cough must run its course and will not respond to antibiotics.  Your caregiver may recommend other treatments if you have a chronic cough. HOME CARE INSTRUCTIONS   Only take over-the-counter or prescription medicines for pain, discomfort, or fever as directed by your caregiver. Use cough suppressants only as directed by your caregiver.  Use a cold steam vaporizer or humidifier in your bedroom or home to help loosen secretions.  Sleep in a semi-upright position if your cough is worse at night.  Rest as needed.  Stop smoking if you smoke. SEEK IMMEDIATE MEDICAL CARE IF:   You have pus in your sputum.  Your cough starts to worsen.  You cannot control your cough with suppressants and are losing sleep.  You begin coughing up blood.  You have difficulty breathing.  You develop pain which is getting worse or is uncontrolled with medicine.  You have a fever. MAKE SURE YOU:   Understand these  instructions.  Will watch your condition.  Will get help right away if you are not doing well or get worse. Document Released: 02/17/2011 Document Revised: 11/13/2011 Document Reviewed: 02/17/2011 [facility] Patient Information 2015 [place], [place]. This information is not intended to replace advice given to you by your health care provider. Make sure you discuss any questions you have with your health care provider.

## 2014-11-12 ENCOUNTER — Emergency Department (HOSPITAL_COMMUNITY)
Admission: EM | Admit: 2014-11-12 | Discharge: 2014-11-13 | Disposition: A | Discharge status: Home / Self Care | Payer: Medicaid Other | Attending: Emergency Medicine | Admitting: Emergency Medicine

## 2014-11-12 DIAGNOSIS — K529 Noninfective gastroenteritis and colitis, unspecified: Secondary | ICD-10-CM | POA: Diagnosis not present

## 2014-11-12 DIAGNOSIS — Z79899 Other long term (current) drug therapy: Secondary | ICD-10-CM | POA: Insufficient documentation

## 2014-11-12 DIAGNOSIS — Z9049 Acquired absence of other specified parts of digestive tract: Secondary | ICD-10-CM | POA: Diagnosis not present

## 2014-11-12 DIAGNOSIS — Z720 Tobacco use: Secondary | ICD-10-CM | POA: Insufficient documentation

## 2014-11-12 DIAGNOSIS — R112 Nausea with vomiting, unspecified: Secondary | ICD-10-CM | POA: Diagnosis present

## 2014-11-12 LAB — COMPREHENSIVE METABOLIC PANEL
ALT: 14 U/L (ref 0–35)
ANION GAP: 9 (ref 5–15)
AST: 19 U/L (ref 0–37)
Albumin: 4.6 g/dL (ref 3.5–5.2)
Alkaline Phosphatase: 55 U/L (ref 39–117)
BILIRUBIN TOTAL: 0.8 mg/dL (ref 0.3–1.2)
BUN: 11 mg/dL (ref 6–23)
CALCIUM: 9.2 mg/dL (ref 8.4–10.5)
CO2: 27 mmol/L (ref 19–32)
Chloride: 105 mmol/L (ref 96–112)
Creatinine, Ser: 0.77 mg/dL (ref 0.50–1.10)
GFR calc Af Amer: >90 mL/min (ref >90)
GFR calc non Af Amer: >90 mL/min (ref >90)
GLUCOSE: 119 mg/dL — AB (ref 70–99)
Potassium: 3.8 mmol/L (ref 3.5–5.1)
Sodium: 141 mmol/L (ref 135–145)
TOTAL PROTEIN: 7.7 g/dL (ref 6.0–8.3)

## 2014-11-12 LAB — URINALYSIS, ROUTINE W REFLEX MICROSCOPIC
Glucose, UA: NEGATIVE mg/dL
Hgb urine dipstick: NEGATIVE
Ketones, ur: >80 mg/dL — AB
NITRITE: NEGATIVE
PROTEIN: NEGATIVE mg/dL
Specific Gravity, Urine: 1.03 (ref 1.005–1.030)
UROBILINOGEN UA: 1 mg/dL (ref 0.0–1.0)
pH: 6 (ref 5.0–8.0)

## 2014-11-12 LAB — CBC WITH DIFFERENTIAL/PLATELET
BASOS PCT: 0 % (ref 0–1)
Basophils Absolute: 0 10*3/uL (ref 0.0–0.1)
EOS ABS: 0.1 10*3/uL (ref 0.0–0.7)
EOS PCT: 0 % (ref 0–5)
HCT: 43.1 % (ref 36.0–46.0)
HEMOGLOBIN: 14.6 g/dL (ref 12.0–15.0)
LYMPHS PCT: 4 % — AB (ref 12–46)
Lymphs Abs: 0.9 10*3/uL (ref 0.7–4.0)
MCH: 32.2 pg (ref 26.0–34.0)
MCHC: 33.9 g/dL (ref 30.0–36.0)
MCV: 94.9 fL (ref 78.0–100.0)
MONOS PCT: 4 % (ref 3–12)
Monocytes Absolute: 0.8 10*3/uL (ref 0.1–1.0)
Neutro Abs: 18.4 10*3/uL — ABNORMAL HIGH (ref 1.7–7.7)
Neutrophils Relative %: 92 % — ABNORMAL HIGH (ref 43–77)
Platelets: 259 10*3/uL (ref 150–400)
RBC: 4.54 MIL/uL (ref 3.87–5.11)
RDW: 14.1 % (ref 11.5–15.5)
WBC: 20.1 10*3/uL — AB (ref 4.0–10.5)

## 2014-11-12 LAB — URINE MICROSCOPIC-ADD ON

## 2014-11-12 LAB — LIPASE, BLOOD: LIPASE: 15 U/L (ref 11–59)

## 2014-11-12 MED ORDER — HYDROMORPHONE HCL 1 MG/ML IJ SOLN
1.0000 mg | Freq: Once | INTRAMUSCULAR | Status: AC
  Administered 2014-11-12: 1 mg via INTRAVENOUS
  Filled 2014-11-12: qty 1

## 2014-11-12 MED ORDER — LORAZEPAM 2 MG/ML IJ SOLN
1.0000 mg | Freq: Once | INTRAMUSCULAR | Status: AC
  Administered 2014-11-13: 1 mg via INTRAVENOUS
  Filled 2014-11-12: qty 1

## 2014-11-12 MED ORDER — PROMETHAZINE HCL 25 MG PO TABS
25.0000 mg | ORAL_TABLET | Freq: Four times a day (QID) | ORAL | Status: DC | PRN
Start: 2014-11-12 — End: 2018-08-28

## 2014-11-12 MED ORDER — SODIUM CHLORIDE 0.9 % IV BOLUS (SEPSIS)
1000.0000 mL | Freq: Once | INTRAVENOUS | Status: AC
  Administered 2014-11-12: 1000 mL via INTRAVENOUS

## 2014-11-12 MED ORDER — PROMETHAZINE HCL 25 MG/ML IJ SOLN
25.0000 mg | Freq: Once | INTRAMUSCULAR | Status: AC
  Administered 2014-11-12: 25 mg via INTRAVENOUS
  Filled 2014-11-12: qty 1

## 2014-11-12 MED ORDER — PANTOPRAZOLE SODIUM 40 MG IV SOLR
40.0000 mg | Freq: Once | INTRAVENOUS | Status: AC
  Administered 2014-11-12: 40 mg via INTRAVENOUS
  Filled 2014-11-12: qty 40

## 2014-11-12 NOTE — ED Provider Notes (Signed)
CSN: [id number]     Arrival date & time 11/12/14  1422 History   First MD Initiated Contact with Patient 11/12/14 1932     Chief Complaint  Patient presents with  . Nausea  . Emesis     (Consider location/radiation/quality/duration/timing/severity/associated sxs/prior Treatment) Patient is a 29 y.o. female presenting with vomiting. The history is provided by the patient. No language interpreter was used.  Emesis Severity:  Moderate Duration:  2 days Timing:  Intermittent Quality:  Stomach contents (now dry heaving) Progression:  Worsening Chronicity:  New Associated symptoms: abdominal pain, chills, diarrhea and fever     No past medical history on file. Past Surgical History  Procedure Laterality Date  . Shoulder surgery    . Appendectomy     Family History  Problem Relation Age of Onset  . Cancer Other    History  Substance Use Topics  . Smoking status: Current Every Day Smoker -- 1.00 packs/day    Types: Cigarettes  . Smokeless tobacco: Never Used  . Alcohol Use: No   OB History    Gravida Para Term Preterm AB TAB SAB Ectopic Multiple Living   0 1 0 1 0 0 3     Review of Systems  Constitutional: Positive for chills.  Gastrointestinal: Positive for vomiting, abdominal pain and diarrhea.  All other systems reviewed and are negative.     Allergies  Peanut-containing drug products; Ibuprofen; Zofran; Chocolate; and Prednisone  Home Medications   Prior to Admission medications   Medication Sig Start Date End Date Taking? Authorizing Provider  Phenylephrine-Pheniramine-DM [facility] COLD & COUGH) 06-24-19 MG PACK Take 1 Package by mouth daily as needed (flu-like symptoms).   Yes Historical Provider, MD  benzonatate (TESSALON) 100 MG capsule Take 1 capsule (100 mg total) by mouth every 8 (eight) hours. Patient not taking: Reported on 11/12/2014 10/24/14   Antony Madura, PA-C  HYDROcodone-homatropine [facility]) 5-1.5 MG/5ML syrup Take 5 mLs by mouth every 8  (eight) hours as needed for cough. Patient not taking: Reported on 11/12/2014 10/24/14   Antony Madura, PA-C  Prenatal Multivit-Min-Fe-FA (PRENATAL VITAMINS) 0.8 MG tablet Take 1 tablet by mouth daily. Patient not taking: Reported on 10/24/2014 10/29/12   Jerelyn Scott, MD  promethazine (PHENERGAN) 25 MG suppository Place 1 suppository (25 mg total) rectally every 6 (six) hours as needed for nausea. 11/21/11 11/28/11  April Palumbo, MD   BP 106/64 mmHg  Pulse 109  Temp(Src) 98.3 F (36.8 C) (Oral)  Resp 18  SpO2 100%  LMP 10/05/2014 Physical Exam  Constitutional: She is oriented to person, place, and time. She appears well-developed and well-nourished.  HENT:  Head: Normocephalic.  Eyes: Pupils are equal, round, and reactive to light.  Neck: Neck supple.  Cardiovascular: Normal heart sounds and intact distal pulses.   Pulmonary/Chest: Effort normal and breath sounds normal.  Abdominal: Soft. She exhibits no distension. There is tenderness.  Non-focal, generalized tenderness  Musculoskeletal: She exhibits no edema or tenderness.  Lymphadenopathy:    She has no cervical adenopathy.  Neurological: She is alert and oriented to person, place, and time.  Skin: Skin is warm. No rash noted.  Psychiatric: She has a normal mood and affect.  Nursing note and vitals reviewed.   ED Course  Procedures (including critical care time) Labs Review Labs Reviewed  CBC WITH DIFFERENTIAL/PLATELET - Abnormal; Notable for the following:    WBC 20.1 (*)    Neutrophils Relative % 92 (*)    Neutro Abs 18.4 (*)  Lymphocytes Relative 4 (*)    All other components within normal limits  COMPREHENSIVE METABOLIC PANEL - Abnormal; Notable for the following:    Glucose, Bld 119 (*)    All other components within normal limits  LIPASE, BLOOD  URINALYSIS, ROUTINE W REFLEX MICROSCOPIC    Imaging Review No results found.   EKG Interpretation None     9:17 PM Patient feeling better after IV fluids and  medication.  Currently tolerating sips of gatoraide.   Patient continues to tolerate po fluids.  Still having some nausea without active vomiting.  Not orthostatic. Patient will be given 1 mg ativan prior to discharge, home with prescription for phenergan.  Return precautions discussed. MDM   Final diagnoses:  None    Gastroenteritis.    Felicie Mornavid Chrisopher Pustejovsky, NP 11/13/14 [phone]  Mirian MoMatthew Gentry, MD 11/16/14 [phone]

## 2014-11-12 NOTE — ED Notes (Signed)
Pt c/o NVD.  Vomiting in the lobby.

## 2014-11-13 NOTE — Discharge Instructions (Signed)

## 2014-11-21 ENCOUNTER — Emergency Department (HOSPITAL_BASED_OUTPATIENT_CLINIC_OR_DEPARTMENT_OTHER): Payer: Medicaid Other

## 2014-11-21 ENCOUNTER — Encounter (HOSPITAL_BASED_OUTPATIENT_CLINIC_OR_DEPARTMENT_OTHER): Payer: Self-pay | Admitting: Emergency Medicine

## 2014-11-21 ENCOUNTER — Emergency Department (HOSPITAL_BASED_OUTPATIENT_CLINIC_OR_DEPARTMENT_OTHER)
Admission: EM | Admit: 2014-11-21 | Discharge: 2014-11-21 | Disposition: A | Discharge status: Home / Self Care | Payer: Medicaid Other | Attending: Emergency Medicine | Admitting: Emergency Medicine

## 2014-11-21 DIAGNOSIS — Y998 Other external cause status: Secondary | ICD-10-CM | POA: Insufficient documentation

## 2014-11-21 DIAGNOSIS — Y9389 Activity, other specified: Secondary | ICD-10-CM | POA: Diagnosis not present

## 2014-11-21 DIAGNOSIS — Z9889 Other specified postprocedural states: Secondary | ICD-10-CM | POA: Insufficient documentation

## 2014-11-21 DIAGNOSIS — Y92410 Unspecified street and highway as the place of occurrence of the external cause: Secondary | ICD-10-CM | POA: Diagnosis not present

## 2014-11-21 DIAGNOSIS — T148 Other injury of unspecified body region: Secondary | ICD-10-CM | POA: Insufficient documentation

## 2014-11-21 DIAGNOSIS — S4990XA Unspecified injury of shoulder and upper arm, unspecified arm, initial encounter: Secondary | ICD-10-CM | POA: Diagnosis not present

## 2014-11-21 MED ORDER — METHOCARBAMOL 500 MG PO TABS
1000.0000 mg | ORAL_TABLET | Freq: Once | ORAL | Status: AC
  Administered 2014-11-21: 1000 mg via ORAL
  Filled 2014-11-21: qty 2

## 2014-11-21 MED ORDER — METHOCARBAMOL 500 MG PO TABS: 500.0000 mg | ORAL_TABLET | Freq: Two times a day (BID) | ORAL | Status: DC

## 2014-11-21 MED ORDER — KETOROLAC TROMETHAMINE 60 MG/2ML IM SOLN
60.0000 mg | Freq: Once | INTRAMUSCULAR | Status: AC
  Administered 2014-11-21: 60 mg via INTRAMUSCULAR
  Filled 2014-11-21: qty 2

## 2014-11-21 MED ORDER — DICLOFENAC EPOLAMINE 1.3 % TD PTCH: 1.0000 | MEDICATED_PATCH | Freq: Two times a day (BID) | TRANSDERMAL | Status: DC

## 2014-11-21 NOTE — Discharge Instructions (Signed)
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
## 2015-03-17 ENCOUNTER — Telehealth: Payer: Self-pay | Admitting: Internal Medicine
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
## 2015-03-22 NOTE — Telephone Encounter (Signed)
Ok with me 

## 2015-03-23 NOTE — Telephone Encounter (Signed)
Patient did call back to schedule appointment.  [place] [facility].  Informed patient that she would have to find a provider that took [place] [facility].

## 2015-03-23 NOTE — Telephone Encounter (Signed)
Left voicemail to call back to schedule
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
## 2016-06-22 ENCOUNTER — Emergency Department (HOSPITAL_COMMUNITY): Payer: Medicaid Other

## 2016-06-22 ENCOUNTER — Emergency Department (HOSPITAL_COMMUNITY)
Admission: EM | Admit: 2016-06-22 | Discharge: 2016-06-23 | Disposition: A | Discharge status: Home / Self Care | Payer: Medicaid Other | Attending: Emergency Medicine | Admitting: Emergency Medicine

## 2016-06-22 ENCOUNTER — Encounter (HOSPITAL_COMMUNITY): Payer: Self-pay | Admitting: Emergency Medicine

## 2016-06-22 DIAGNOSIS — S81802A Unspecified open wound, left lower leg, initial encounter: Secondary | ICD-10-CM | POA: Diagnosis not present

## 2016-06-22 DIAGNOSIS — W228XXA Striking against or struck by other objects, initial encounter: Secondary | ICD-10-CM | POA: Insufficient documentation

## 2016-06-22 DIAGNOSIS — Y999 Unspecified external cause status: Secondary | ICD-10-CM | POA: Insufficient documentation

## 2016-06-22 DIAGNOSIS — S8992XA Unspecified injury of left lower leg, initial encounter: Secondary | ICD-10-CM | POA: Diagnosis present

## 2016-06-22 DIAGNOSIS — Y92812 Truck as the place of occurrence of the external cause: Secondary | ICD-10-CM | POA: Insufficient documentation

## 2016-06-22 DIAGNOSIS — Y939 Activity, unspecified: Secondary | ICD-10-CM | POA: Diagnosis not present

## 2016-06-22 DIAGNOSIS — Z91010 Allergy to peanuts: Secondary | ICD-10-CM | POA: Insufficient documentation

## 2016-06-22 DIAGNOSIS — F17210 Nicotine dependence, cigarettes, uncomplicated: Secondary | ICD-10-CM | POA: Insufficient documentation

## 2016-06-22 DIAGNOSIS — S8012XA Contusion of left lower leg, initial encounter: Secondary | ICD-10-CM

## 2016-06-22 MED ORDER — HYDROCODONE-ACETAMINOPHEN 5-325 MG PO TABS: 1.0000 | ORAL_TABLET | Freq: Four times a day (QID) | ORAL | 0 refills | Status: DC | PRN

## 2016-06-22 MED ORDER — HYDROCODONE-ACETAMINOPHEN 5-325 MG PO TABS
1.0000 | ORAL_TABLET | Freq: Once | ORAL | Status: AC
  Administered 2016-06-23: 1 via ORAL
  Filled 2016-06-22: qty 1

## 2016-06-22 MED ORDER — BACITRACIN ZINC 500 UNIT/GM EX OINT: TOPICAL_OINTMENT | Freq: Two times a day (BID) | CUTANEOUS | Status: DC

## 2016-06-22 NOTE — Discharge Instructions (Signed)
Elevate the leg as often as possible, apply ice, use the crutches when walking. Follow up with the orthopedic doctor. Return here as needed. Do not drive while taking the narcotic pain medication as it will make you sleepy.
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
## 2016-06-22 NOTE — ED Provider Notes (Signed)
[facility]-EMERGENCY DEPT Provider Note  CSN: [id number] Arrival date & time: 06/22/16  2144 By signing my name below, I, [name], attest that this documentation has been prepared under the direction and in the presence of Kerrie BuffaloHope Neese, [place]. Electronically Signed: [name], ED Scribe. 06/22/16. 11:18 PM.  History   Chief Complaint Chief Complaint  Patient presents with  . Leg Injury   HPI Comments: [name] is a 30 y.o. female with no pertinent PMHx, who presents to the Emergency Department complaining of left lower leg pain with swelling s/p mechanical injury last night. Pt states she was driving a tractor trailer when the clutch pedal slipped and struck her leg. Pain is exacerbated with ambulating and palpation. She has not tried any OTC medications PTA. Pt denies any additional injuries. She further denies fever, chills, or any other associated symptoms. Pt's Tdap is UTD.  The history is provided by the patient. No language interpreter was used.  Extremity Pain  This is a new problem. The current episode started yesterday. The problem occurs constantly. The problem has not changed since onset.The symptoms are aggravated by walking, standing and exertion.    History reviewed. No pertinent past medical history.  There are no active problems to display for this patient.   Past Surgical History:  Procedure Laterality Date  . APPENDECTOMY    . SHOULDER SURGERY      OB History    Gravida Para Term Preterm AB Living   5 3 3  0 1 3   SAB TAB Ectopic Multiple Live Births   1 0 0 0         Home Medications    Prior to Admission medications   Medication Sig Start Date End Date Taking? Authorizing Provider  benzonatate (TESSALON) 100 MG capsule Take 1 capsule (100 mg total) by mouth every 8 (eight) hours. Patient not taking: Reported on 11/12/2014 10/24/14   Antony MaduraKelly Humes, PA-C  diclofenac (FLECTOR) 1.3 % PTCH Place 1 patch onto the skin 2 (two) times daily. 11/21/14   April  Palumbo, MD  HYDROcodone-acetaminophen (NORCO) 5-325 MG tablet Take 1 tablet by mouth every 6 (six) hours as needed. 06/22/16   Hope Orlene OchM Neese, NP  HYDROcodone-homatropine (HYCODAN) 5-1.5 MG/5ML syrup Take 5 mLs by mouth every 8 (eight) hours as needed for cough. Patient not taking: Reported on 11/12/2014 10/24/14   Antony MaduraKelly Humes, PA-C  methocarbamol (ROBAXIN) 500 MG tablet Take 1 tablet (500 mg total) by mouth 2 (two) times daily. 11/21/14   April Palumbo, MD  Phenylephrine-Pheniramine-DM [facility](THERAFLU COLD & COUGH) 06-24-19 MG PACK Take 1 Package by mouth daily as needed (flu-like symptoms).    Historical Provider, MD  Prenatal Multivit-Min-Fe-FA (PRENATAL VITAMINS) 0.8 MG tablet Take 1 tablet by mouth daily. Patient not taking: Reported on 10/24/2014 10/29/12   Jerelyn ScottMartha Linker, MD  promethazine (PHENERGAN) 25 MG tablet Take 1 tablet (25 mg total) by mouth every 6 (six) hours as needed for nausea or vomiting. 11/12/14   Felicie Mornavid Smith, NP    Family History Family History  Problem Relation Age of Onset  . Cancer Other     Social History Social History  Substance Use Topics  . Smoking status: Current Every Day Smoker    Packs/day: 1.00    Types: Cigarettes  . Smokeless tobacco: Never Used  . Alcohol use No     Allergies   Peanut-containing drug products; Ibuprofen [ibuprofen]; Zofran; Chocolate; and Prednisone   Review of Systems Review of Systems  Constitutional: Negative  for chills and fever.  Musculoskeletal: Positive for joint swelling and myalgias.  All other systems reviewed and are negative.    Physical Exam Updated Vital Signs BP 112/60 (BP Location: Right Arm)   Pulse 67   Temp 98.7 F (37.1 C) (Oral)   Resp 22   SpO2 100%   Physical Exam  Constitutional: She appears well-developed and well-nourished. No distress.  HENT:  Head: Normocephalic.  Eyes: Conjunctivae are normal.  Neck: Neck supple.  Cardiovascular: Normal rate.   Pulmonary/Chest: Effort normal. No respiratory  distress.  Abdominal: She exhibits no distension.  Musculoskeletal:       Left lower leg: She exhibits tenderness, swelling and laceration.       Legs: Pedal pulses 2+. Adequate circulation. Swelling and ecchymosis to the dorsum of left foot that extends to the lateral aspect of the ankle and extends to the lower leg. Wound noted to the anterior aspect of the lower leg just above the ankle. No calf tenderness. No red streaking, drainage from the wound or signs of infection.   Neurological: She is alert.  Skin: Skin is warm and dry.  Psychiatric: She has a normal mood and affect. Her behavior is normal.  Nursing note and vitals reviewed.    ED Treatments / Results  DIAGNOSTIC STUDIES: Oxygen Saturation is 99% on RA, normal by my interpretation.    COORDINATION OF CARE: 11:18 PM Discussed treatment plan with pt at bedside which includes x-ray and pt agreed to plan.  Labs (all labs ordered are listed, but only abnormal results are displayed) Labs Reviewed - No data to display   Radiology Dg Tibia/fibula Left  Result Date: 06/22/2016 CLINICAL DATA:  30 year old female with right lower extremity trauma and pain. EXAM: LEFT TIBIA AND FIBULA - 2 VIEW COMPARISON:  None. FINDINGS: There is no acute fracture or dislocation. The bones are well mineralized. No arthritic changes. There is mild subcutaneous edema of the distal leg and over the lateral aspect of the ankle. IMPRESSION: Negative. Electronically Signed   By: Elgie Collard M.D.   On: 06/22/2016 23:03    Procedures Procedures (including critical care time)  Medications Ordered in ED Medications  bacitracin ointment (not administered)  HYDROcodone-acetaminophen (NORCO/VICODIN) 5-325 MG per tablet 1 tablet (1 tablet Oral Given 06/23/16 0010)     Initial Impression / Assessment and Plan / ED Course  I have reviewed the triage vital signs and the nursing notes.  Pertinent imaging results that were available during my care of  the patient were reviewed by me and considered in my medical decision making (see chart for details).  Clinical Course  Discussed with Dr. Bebe Shaggy. Ice elevation, ace wrap, crutches, pain management and f/u with ortho if symptoms persist. Patient stable for d/c without focal neuro deficits.   Final Clinical Impressions(s) / ED Diagnoses   Final diagnoses:  Contusion of left lower leg, initial encounter  Wound of left lower extremity, initial encounter    New Prescriptions Discharge Medication List as of 06/23/2016 12:04 AM    START taking these medications   Details  HYDROcodone-acetaminophen (NORCO) 5-325 MG tablet Take 1 tablet by mouth every 6 (six) hours as needed., Starting Thu 06/22/2016, Print       I personally performed the services described in this documentation, which was scribed in my presence. The recorded information has been reviewed and is accurate.     [address] [place], NP 06/23/16 1610    Zadie Rhine, MD 06/23/16 [id number]

## 2018-08-28 ENCOUNTER — Emergency Department (HOSPITAL_BASED_OUTPATIENT_CLINIC_OR_DEPARTMENT_OTHER)
Admission: EM | Admit: 2018-08-28 | Discharge: 2018-08-29 | Disposition: A | Discharge status: Home / Self Care | Payer: Medicaid Other | Attending: Emergency Medicine | Admitting: Emergency Medicine

## 2018-08-28 ENCOUNTER — Encounter (HOSPITAL_BASED_OUTPATIENT_CLINIC_OR_DEPARTMENT_OTHER): Payer: Self-pay | Admitting: Adult Health

## 2018-08-28 ENCOUNTER — Other Ambulatory Visit: Payer: Self-pay

## 2018-08-28 DIAGNOSIS — F17210 Nicotine dependence, cigarettes, uncomplicated: Secondary | ICD-10-CM | POA: Insufficient documentation

## 2018-08-28 DIAGNOSIS — Z91010 Allergy to peanuts: Secondary | ICD-10-CM | POA: Insufficient documentation

## 2018-08-28 DIAGNOSIS — R112 Nausea with vomiting, unspecified: Secondary | ICD-10-CM

## 2018-08-28 DIAGNOSIS — R109 Unspecified abdominal pain: Secondary | ICD-10-CM | POA: Diagnosis not present

## 2018-08-28 DIAGNOSIS — R197 Diarrhea, unspecified: Secondary | ICD-10-CM | POA: Diagnosis not present

## 2018-08-28 DIAGNOSIS — E119 Type 2 diabetes mellitus without complications: Secondary | ICD-10-CM | POA: Diagnosis not present

## 2018-08-28 DIAGNOSIS — R509 Fever, unspecified: Secondary | ICD-10-CM | POA: Diagnosis not present

## 2018-08-28 HISTORY — DX: Type 2 diabetes mellitus without complications: E11.9

## 2018-08-28 LAB — COMPREHENSIVE METABOLIC PANEL
ALBUMIN: 4.4 g/dL (ref 3.5–5.0)
ALT: 22 U/L (ref 0–44)
AST: 24 U/L (ref 15–41)
Alkaline Phosphatase: 44 U/L (ref 38–126)
Anion gap: 6 (ref 5–15)
BUN: 9 mg/dL (ref 6–20)
CO2: 22 mmol/L (ref 22–32)
Calcium: 8.9 mg/dL (ref 8.9–10.3)
Chloride: 106 mmol/L (ref 98–111)
Creatinine, Ser: 0.74 mg/dL (ref 0.44–1.00)
GFR calc Af Amer: >60 mL/min (ref >60)
GFR calc non Af Amer: >60 mL/min (ref >60)
GLUCOSE: 108 mg/dL — AB (ref 70–99)
POTASSIUM: 3.4 mmol/L — AB (ref 3.5–5.1)
Sodium: 134 mmol/L — ABNORMAL LOW (ref 135–145)
TOTAL PROTEIN: 7.1 g/dL (ref 6.5–8.1)
Total Bilirubin: 0.4 mg/dL (ref 0.3–1.2)

## 2018-08-28 LAB — URINALYSIS, ROUTINE W REFLEX MICROSCOPIC
Glucose, UA: NEGATIVE mg/dL
Hgb urine dipstick: NEGATIVE
LEUKOCYTES UA: NEGATIVE
Nitrite: NEGATIVE
PROTEIN: NEGATIVE mg/dL
Specific Gravity, Urine: >1.03 — ABNORMAL HIGH (ref 1.005–1.030)
pH: 6 (ref 5.0–8.0)

## 2018-08-28 LAB — CBC WITH DIFFERENTIAL/PLATELET
Abs Immature Granulocytes: 0.03 10*3/uL (ref 0.00–0.07)
BASOS ABS: 0 10*3/uL (ref 0.0–0.1)
Basophils Relative: 0 %
Eosinophils Absolute: 0 10*3/uL (ref 0.0–0.5)
Eosinophils Relative: 1 %
HCT: 39.8 % (ref 36.0–46.0)
Hemoglobin: 13 g/dL (ref 12.0–15.0)
Immature Granulocytes: 0 %
LYMPHS ABS: 0.3 10*3/uL — AB (ref 0.7–4.0)
Lymphocytes Relative: 4 %
MCH: 31.8 pg (ref 26.0–34.0)
MCHC: 32.7 g/dL (ref 30.0–36.0)
MCV: 97.3 fL (ref 80.0–100.0)
Monocytes Absolute: 0.7 10*3/uL (ref 0.1–1.0)
Monocytes Relative: 9 %
NRBC: 0 % (ref 0.0–0.2)
Neutro Abs: 6.8 10*3/uL (ref 1.7–7.7)
Neutrophils Relative %: 86 %
Platelets: 180 10*3/uL (ref 150–400)
RBC: 4.09 MIL/uL (ref 3.87–5.11)
RDW: 13.7 % (ref 11.5–15.5)
WBC: 7.9 10*3/uL (ref 4.0–10.5)

## 2018-08-28 LAB — LIPASE, BLOOD: Lipase: 18 U/L (ref 11–51)

## 2018-08-28 LAB — CBG MONITORING, ED: Glucose-Capillary: 100 mg/dL — ABNORMAL HIGH (ref 70–99)

## 2018-08-28 LAB — PREGNANCY, URINE: Preg Test, Ur: NEGATIVE

## 2018-08-28 MED ORDER — PROMETHAZINE HCL 25 MG/ML IJ SOLN
12.5000 mg | Freq: Once | INTRAMUSCULAR | Status: AC
  Administered 2018-08-28: 12.5 mg via INTRAVENOUS
  Filled 2018-08-28: qty 1

## 2018-08-28 MED ORDER — SODIUM CHLORIDE 0.9 % IV BOLUS
500.0000 mL | Freq: Once | INTRAVENOUS | Status: AC
  Administered 2018-08-28: 500 mL via INTRAVENOUS

## 2018-08-28 MED ORDER — PROMETHAZINE HCL 25 MG PO TABS
12.5000 mg | ORAL_TABLET | Freq: Once | ORAL | Status: AC
  Administered 2018-08-28: 12.5 mg via ORAL
  Filled 2018-08-28: qty 1

## 2018-08-28 MED ORDER — IBUPROFEN 800 MG PO TABS
800.0000 mg | ORAL_TABLET | Freq: Once | ORAL | Status: AC
  Administered 2018-08-28: 800 mg via ORAL
  Filled 2018-08-28: qty 1

## 2018-08-28 MED ORDER — SODIUM CHLORIDE 0.9 % IV BOLUS
1000.0000 mL | Freq: Once | INTRAVENOUS | Status: AC
  Administered 2018-08-28: 1000 mL via INTRAVENOUS

## 2018-08-28 MED ORDER — POTASSIUM CHLORIDE CRYS ER 20 MEQ PO TBCR
40.0000 meq | EXTENDED_RELEASE_TABLET | Freq: Once | ORAL | Status: AC
  Administered 2018-08-28: 40 meq via ORAL
  Filled 2018-08-28: qty 2

## 2018-08-28 MED ORDER — PROMETHAZINE HCL 25 MG RE SUPP: 25.0000 mg | Freq: Three times a day (TID) | RECTAL | 0 refills | Status: DC | PRN

## 2018-08-28 MED ORDER — ACETAMINOPHEN 500 MG PO TABS
1000.0000 mg | ORAL_TABLET | Freq: Once | ORAL | Status: AC
  Administered 2018-08-28: 1000 mg via ORAL
  Filled 2018-08-28: qty 2

## 2018-08-28 NOTE — ED Notes (Signed)
Pt able to sip on Sprite without vomiting.

## 2018-08-28 NOTE — ED Notes (Signed)
Pt notified of need for urine. Given a cup of water to assist.

## 2018-08-28 NOTE — ED Notes (Signed)
Patient is unable to tolerate PO fluids and is still actively vomiting.

## 2018-08-28 NOTE — ED Triage Notes (Addendum)
Presents with one day of nausea, vomiting, chills and body aches. She took nyquil. She states, "My hurts so badly" pt is tearful and curled in a balled on the chair. Reports that she threw up 7 times and had diarrhea as well. Pt is diabetic per her mother but does not take medication

## 2018-08-28 NOTE — ED Notes (Signed)
Pt requesting cold sprite for PO challenge. Pt given sprite.

## 2018-08-28 NOTE — ED Notes (Signed)
Pt given PO challenge of water

## 2018-08-28 NOTE — Discharge Instructions (Addendum)
Take medications as directed.   Please follow up with your primary doctor within the next 5-7 days.    Please return to the ER sooner if you have any new or worsening symptoms, or if you have any of the following symptoms:  Abdominal pain that does not go away.  You have a fever.  You keep throwing up (vomiting).  The pain is felt only in portions of the abdomen. Pain in the right side could possibly be appendicitis. In an adult, pain in the left lower portion of the abdomen could be colitis or diverticulitis.  You pass bloody or black tarry stools.  There is bright red blood in the stool.  The constipation stays for more than 4 days.  There is belly (abdominal) or rectal pain.  You do not seem to be getting better.  You have any questions or concerns.

## 2018-08-28 NOTE — ED Provider Notes (Signed)
[facility] EMERGENCY DEPARTMENT Provider Note   CSN: [id number] Arrival date & time: 08/28/18  1657     History   Chief Complaint Chief Complaint  Patient presents with  . Nausea    HPI Kayla Diaz is a 32 y.o. female.  HPI   Pt is a 32 y/o female with a reported h/o diabetes who presents to the ED today for evaluation of nausea, vomiting (x8), body aches, chills, sweats. She also c/o a nonproductive cough that began 2 days ago.  Denies sore throat, congestion, rhinorrhea. She reports diarrhea and abd cramping. Pt has tried taking nyquil without relief. Denies urinary sxs.  Pt has not received her flu shot this year. Has had family members with similar sxs.  She reports a history of marijuana use, last used last night.  Past Medical History:  Diagnosis Date  . Diabetes (HCC)     There are no active problems to display for this patient.   Past Surgical History:  Procedure Laterality Date  . APPENDECTOMY    . SHOULDER SURGERY       OB History    Gravida  5   Para  3   Term  3   Preterm  0   AB  1   Living  3     SAB  1   TAB  0   Ectopic  0   Multiple  0   Live Births              Home Medications    Prior to Admission medications   Medication Sig Start Date End Date Taking? Authorizing Provider  benzonatate (TESSALON) 100 MG capsule Take 1 capsule (100 mg total) by mouth every 8 (eight) hours. Patient not taking: Reported on 11/12/2014 10/24/14   Antony MaduraHumes, Kelly, PA-C  diclofenac (FLECTOR) 1.3 % PTCH Place 1 patch onto the skin 2 (two) times daily. 11/21/14   Palumbo, April, MD  HYDROcodone-acetaminophen (NORCO) 5-325 MG tablet Take 1 tablet by mouth every 6 (six) hours as needed. 06/22/16   Janne NapoleonNeese, Hope M, NP  HYDROcodone-homatropine [facility](HYCODAN) 5-1.5 MG/5ML syrup Take 5 mLs by mouth every 8 (eight) hours as needed for cough. Patient not taking: Reported on 11/12/2014 10/24/14   Antony MaduraHumes, Kelly, PA-C  methocarbamol (ROBAXIN) 500  MG tablet Take 1 tablet (500 mg total) by mouth 2 (two) times daily. 11/21/14   Palumbo, April, MD  Phenylephrine-Pheniramine-DM [facility](THERAFLU COLD & COUGH) 06-24-19 MG PACK Take 1 Package by mouth daily as needed (flu-like symptoms).    [provider]  Prenatal Multivit-Min-Fe-FA (PRENATAL VITAMINS) 0.8 MG tablet Take 1 tablet by mouth daily. Patient not taking: Reported on 10/24/2014 10/29/12   Phillis HaggisMabe, Martha L, MD  promethazine (PHENERGAN) 25 MG suppository Place 1 suppository (25 mg total) rectally every 8 (eight) hours as needed for nausea or vomiting. 08/28/18   Rashi Granier S, PA-C    Family History Family History  Problem Relation Age of Onset  . Cancer Other     Social History Social History   Tobacco Use  . Smoking status: Current Every Day Smoker    Packs/day: 1.00    Types: Cigarettes  . Smokeless tobacco: Never Used  Substance Use Topics  . Alcohol use: No  . Drug use: No    Types: Marijuana     Allergies   Peanut-containing drug products; Ibuprofen [ibuprofen]; Zofran; Chocolate; and Prednisone   Review of Systems Review of Systems  Constitutional: Positive for chills, diaphoresis and fever.  HENT: Negative for congestion and sore throat.   Respiratory: Positive for cough. Negative for shortness of breath.   Cardiovascular: Negative for chest pain.  Gastrointestinal: Positive for abdominal pain (cramping), diarrhea, nausea and vomiting. Negative for constipation.  Genitourinary: Negative for dysuria, frequency, vaginal bleeding and vaginal discharge.  Musculoskeletal: Positive for myalgias.  Skin: Negative for rash.    Physical Exam Updated Vital Signs BP 102/66   Pulse 68   Temp 98.9 F (37.2 C)   Resp 18   Ht 5\' 4"  (1.626 m)   Wt 63.5 kg   SpO2 98%   BMI 24.03 kg/m   Physical Exam Vitals signs and nursing note reviewed.  Constitutional:      General: She is not in acute distress.    Appearance: She is well-developed.     Comments:  Patient writhing on exam bed and is tearful and anxious  HENT:     Head: Normocephalic and atraumatic.     Right Ear: Tympanic membrane normal.     Left Ear: Tympanic membrane normal.     Nose: Nose normal. No congestion or rhinorrhea.     Mouth/Throat:     Mouth: Mucous membranes are dry.     Pharynx: No oropharyngeal exudate or posterior oropharyngeal erythema.  Eyes:     Conjunctiva/sclera: Conjunctivae normal.  Neck:     Musculoskeletal: Neck supple.  Cardiovascular:     Rate and Rhythm: Normal rate and regular rhythm.     Heart sounds: Normal heart sounds. No murmur.  Pulmonary:     Effort: Pulmonary effort is normal. No respiratory distress.     Breath sounds: Normal breath sounds. No wheezing or rhonchi.  Abdominal:     General: Bowel sounds are normal. There is no distension.     Palpations: Abdomen is soft.     Tenderness: There is no abdominal tenderness. There is no right CVA tenderness, left CVA tenderness, guarding or rebound.  Skin:    General: Skin is warm and dry.     Capillary Refill: Capillary refill takes less than 2 seconds.  Neurological:     Mental Status: She is alert.  Psychiatric:     Comments: Anxious     ED Treatments / Results  Labs (all labs ordered are listed, but only abnormal results are displayed) Labs Reviewed  CBC WITH DIFFERENTIAL/PLATELET - Abnormal; Notable for the following components:      Result Value   Lymphs Abs 0.3 (*)    All other components within normal limits  COMPREHENSIVE METABOLIC PANEL - Abnormal; Notable for the following components:   Sodium 134 (*)    Potassium 3.4 (*)    Glucose, Bld 108 (*)    All other components within normal limits  URINALYSIS, ROUTINE W REFLEX MICROSCOPIC - Abnormal; Notable for the following components:   APPearance HAZY (*)    Specific Gravity, Urine >1.030 (*)    Bilirubin Urine SMALL (*)    Ketones, ur >80 (*)    All other components within normal limits  CBG MONITORING, ED -  Abnormal; Notable for the following components:   Glucose-Capillary 100 (*)    All other components within normal limits  LIPASE, BLOOD  PREGNANCY, URINE    EKG None  Radiology No results found.  Procedures Procedures (including critical care time)  Medications Ordered in ED Medications  promethazine (PHENERGAN) injection 12.5 mg (12.5 mg Intravenous Given 08/28/18 1730)  acetaminophen (TYLENOL) tablet 1,000 mg (1,000 mg Oral Given 08/28/18 1753)  ibuprofen (  ADVIL,MOTRIN) tablet 800 mg (800 mg Oral Given 08/28/18 1753)  sodium chloride 0.9 % bolus 500 mL (0 mLs Intravenous Stopped 08/28/18 1842)  potassium chloride SA (K-DUR,KLOR-CON) CR tablet 40 mEq (40 mEq Oral Given 08/28/18 1839)  sodium chloride 0.9 % bolus 1,000 mL (0 mLs Intravenous Stopped 08/28/18 2212)  promethazine (PHENERGAN) tablet 12.5 mg (12.5 mg Oral Given 08/28/18 2211)  promethazine (PHENERGAN) injection 12.5 mg (12.5 mg Intravenous Given 08/28/18 2235)     Initial Impression / Assessment and Plan / ED Course  I have reviewed the triage vital signs and the nursing notes.  Pertinent labs & imaging results that were available during my care of the patient were reviewed by me and considered in my medical decision making (see chart for details).    Final Clinical Impressions(s) / ED Diagnoses   Final diagnoses:  Nausea vomiting and diarrhea   Patient presented with nausea vomiting and diarrhea that began earlier today.  He is also had a mild cough without associated rhinorrhea, congestion or other symptoms.  No fevers here, vital signs have been stable.  Abdomen is soft and nontender.  Labs are reassuring.  CBC is without leukocytosis or anemia.  CMP shows a slight hypokalemia with potassium of 3.4.  Also with slight hyponatremia with sodium of 134.  Glucose is 108.  No elevated anion gap.  Lipase normal.  UA without leukocytes or nitrites.  She does have some bilirubinuria and react.  Pregnancy test is  negative.  Patient has received IV fluid hydration in the ED as well as antiemetics.  She has been able to tolerate p.o.  States she feels improved and has been able to sleep comfortably in the ED.  Feel that she likely has a viral illness causing her symptoms.  Doubt acute intra-abdominal cause of would cry or further work-up or imaging at this time.  Will give patient Rx for Phenergan for home and have her return to the emergency department for new or worsening symptoms.  I have also discussed that her marijuana use may be contributing to her symptoms today and that she should discontinue marijuana use.  She voices understanding.  Will give information for PCP follow-up.  Return precautions discussed.  She and her mother at bedside voiced an understanding the plan and reasons to return the ED peer all questions answered.  ED Discharge Orders         Ordered    promethazine (PHENERGAN) 25 MG suppository  Every 8 hours PRN     08/28/18 2347           Karrie Meres, PA-C 08/28/18 2351    Tegeler, Canary Brim, MD 08/29/18 [phone]

## 2018-08-28 NOTE — ED Notes (Signed)
Pt sleeping on right side, obvious rise and fall of chest noted. No signs of acute distress. RN will continue to monitor.

## 2018-08-31 ENCOUNTER — Encounter (HOSPITAL_BASED_OUTPATIENT_CLINIC_OR_DEPARTMENT_OTHER): Payer: Self-pay | Admitting: *Deleted

## 2018-08-31 ENCOUNTER — Emergency Department (HOSPITAL_BASED_OUTPATIENT_CLINIC_OR_DEPARTMENT_OTHER): Payer: Medicaid Other

## 2018-08-31 ENCOUNTER — Other Ambulatory Visit: Payer: Self-pay

## 2018-08-31 ENCOUNTER — Emergency Department (HOSPITAL_BASED_OUTPATIENT_CLINIC_OR_DEPARTMENT_OTHER)
Admission: EM | Admit: 2018-08-31 | Discharge: 2018-09-01 | Disposition: A | Discharge status: Home / Self Care | Payer: Medicaid Other | Attending: Emergency Medicine | Admitting: Emergency Medicine

## 2018-08-31 DIAGNOSIS — E119 Type 2 diabetes mellitus without complications: Secondary | ICD-10-CM | POA: Diagnosis not present

## 2018-08-31 DIAGNOSIS — R188 Other ascites: Secondary | ICD-10-CM | POA: Diagnosis not present

## 2018-08-31 DIAGNOSIS — E876 Hypokalemia: Secondary | ICD-10-CM | POA: Diagnosis not present

## 2018-08-31 DIAGNOSIS — R1110 Vomiting, unspecified: Secondary | ICD-10-CM | POA: Diagnosis not present

## 2018-08-31 DIAGNOSIS — F17210 Nicotine dependence, cigarettes, uncomplicated: Secondary | ICD-10-CM | POA: Insufficient documentation

## 2018-08-31 DIAGNOSIS — R112 Nausea with vomiting, unspecified: Secondary | ICD-10-CM | POA: Diagnosis not present

## 2018-08-31 DIAGNOSIS — R197 Diarrhea, unspecified: Secondary | ICD-10-CM | POA: Insufficient documentation

## 2018-08-31 DIAGNOSIS — Z91010 Allergy to peanuts: Secondary | ICD-10-CM | POA: Diagnosis not present

## 2018-08-31 LAB — COMPREHENSIVE METABOLIC PANEL
ALT: 30 U/L (ref 0–44)
AST: 40 U/L (ref 15–41)
Albumin: 4.2 g/dL (ref 3.5–5.0)
Alkaline Phosphatase: 37 U/L — ABNORMAL LOW (ref 38–126)
Anion gap: 11 (ref 5–15)
BUN: 8 mg/dL (ref 6–20)
CO2: 26 mmol/L (ref 22–32)
Calcium: 8.7 mg/dL — ABNORMAL LOW (ref 8.9–10.3)
Chloride: 101 mmol/L (ref 98–111)
Creatinine, Ser: 0.74 mg/dL (ref 0.44–1.00)
GFR calc Af Amer: >60 mL/min (ref >60)
Glucose, Bld: 89 mg/dL (ref 70–99)
Potassium: 2.8 mmol/L — ABNORMAL LOW (ref 3.5–5.1)
Sodium: 138 mmol/L (ref 135–145)
Total Bilirubin: 0.3 mg/dL (ref 0.3–1.2)
Total Protein: 7.3 g/dL (ref 6.5–8.1)

## 2018-08-31 LAB — CBC
HCT: 42.4 % (ref 36.0–46.0)
Hemoglobin: 14.1 g/dL (ref 12.0–15.0)
MCH: 31.5 pg (ref 26.0–34.0)
MCHC: 33.3 g/dL (ref 30.0–36.0)
MCV: 94.6 fL (ref 80.0–100.0)
Platelets: 122 10*3/uL — ABNORMAL LOW (ref 150–400)
RBC: 4.48 MIL/uL (ref 3.87–5.11)
RDW: 13.4 % (ref 11.5–15.5)
WBC: 3.8 10*3/uL — ABNORMAL LOW (ref 4.0–10.5)
nRBC: 0 % (ref 0.0–0.2)

## 2018-08-31 LAB — LIPASE, BLOOD: Lipase: 28 U/L (ref 11–51)

## 2018-08-31 MED ORDER — IOPAMIDOL (ISOVUE-300) INJECTION 61%: 25.0000 mL | Freq: Once | INTRAVENOUS | Status: DC

## 2018-08-31 MED ORDER — SODIUM CHLORIDE 0.9 % IV BOLUS
1000.0000 mL | Freq: Once | INTRAVENOUS | Status: AC
  Administered 2018-08-31: 1000 mL via INTRAVENOUS

## 2018-08-31 MED ORDER — PROMETHAZINE HCL 25 MG RE SUPP: 25.0000 mg | Freq: Three times a day (TID) | RECTAL | 0 refills | Status: DC | PRN

## 2018-08-31 MED ORDER — LOPERAMIDE HCL 2 MG PO CAPS: 2.0000 mg | ORAL_CAPSULE | Freq: Four times a day (QID) | ORAL | 0 refills | Status: DC | PRN

## 2018-08-31 MED ORDER — PROMETHAZINE HCL 25 MG/ML IJ SOLN
25.0000 mg | Freq: Once | INTRAMUSCULAR | Status: AC
  Administered 2018-08-31: 25 mg via INTRAVENOUS
  Filled 2018-08-31: qty 1

## 2018-08-31 MED ORDER — IOPAMIDOL (ISOVUE-300) INJECTION 61%: 30.0000 mL | Freq: Once | INTRAVENOUS | Status: DC

## 2018-08-31 MED ORDER — IOPAMIDOL (ISOVUE-300) INJECTION 61%
100.0000 mL | Freq: Once | INTRAVENOUS | Status: AC | PRN
  Administered 2018-08-31: 100 mL via INTRAVENOUS

## 2018-08-31 MED ORDER — KETOROLAC TROMETHAMINE 15 MG/ML IJ SOLN
7.5000 mg | Freq: Once | INTRAMUSCULAR | Status: AC
  Administered 2018-08-31: 7.5 mg via INTRAVENOUS

## 2018-08-31 MED ORDER — POTASSIUM CHLORIDE ER 20 MEQ PO TBCR: 20.0000 meq | EXTENDED_RELEASE_TABLET | Freq: Every day | ORAL | 0 refills | Status: DC

## 2018-08-31 MED ORDER — METOCLOPRAMIDE HCL 5 MG/ML IJ SOLN
10.0000 mg | Freq: Once | INTRAMUSCULAR | Status: AC
  Administered 2018-08-31: 10 mg via INTRAVENOUS
  Filled 2018-08-31: qty 2

## 2018-08-31 MED ORDER — AMOXICILLIN-POT CLAVULANATE 875-125 MG PO TABS: 1.0000 | ORAL_TABLET | Freq: Two times a day (BID) | ORAL | 0 refills | Status: DC

## 2018-08-31 MED ORDER — SODIUM CHLORIDE 0.9 % IV SOLN
INTRAVENOUS | Status: DC | PRN
  Administered 2018-08-31: 250 mL via INTRAVENOUS

## 2018-08-31 MED ORDER — DIPHENHYDRAMINE HCL 50 MG/ML IJ SOLN
12.5000 mg | Freq: Once | INTRAMUSCULAR | Status: AC
  Administered 2018-08-31: 12.5 mg via INTRAVENOUS
  Filled 2018-08-31: qty 1

## 2018-08-31 MED ORDER — POTASSIUM CHLORIDE 10 MEQ/100ML IV SOLN
10.0000 meq | INTRAVENOUS | Status: AC
  Administered 2018-08-31 (×2): 10 meq via INTRAVENOUS
  Filled 2018-08-31 (×2): qty 100

## 2018-08-31 MED ORDER — KETOROLAC TROMETHAMINE 15 MG/ML IJ SOLN
INTRAMUSCULAR | Status: AC
  Filled 2018-08-31: qty 1

## 2018-08-31 NOTE — ED Notes (Signed)
Pt just started drinking contrast

## 2018-08-31 NOTE — ED Notes (Signed)
Pt actively vomiting. Unable to drink contrast. EDP notified and CT notified.

## 2018-08-31 NOTE — ED Notes (Signed)
Pt aware we need urine specimen.  

## 2018-08-31 NOTE — Discharge Instructions (Signed)
Take antibiotics and potassium as prescribed. Use Imodium as needed for diarrhea. Use Phenergan as needed for nausea or vomiting. Follow-up with the stomach doctors for further evaluation of symptoms if you are still having nausea, vomiting, diarrhea. Use Tylenol as needed for pain. Make sure you stay well-hydrated with water. Return to the emergency room with any new, worsening, concerning symptoms.

## 2018-08-31 NOTE — ED Notes (Signed)
Patient transported to CT 

## 2018-08-31 NOTE — ED Triage Notes (Signed)
N/V/D that continues.  Pt dry heaving in triage. Ambulatory.

## 2018-09-01 NOTE — ED Provider Notes (Signed)
[facility] EMERGENCY DEPARTMENT Provider Note   CSN: [id number] Arrival date & time: 08/31/18  1830     History   Chief Complaint Chief Complaint  Patient presents with  . Emesis    HPI Kayla Diaz is a 32 y.o. female presenting for evaluation of nausea, vomiting, diarrhea, generalized body aches.  Patient states she has been having nausea, vomiting, diarrhea for the past 2 weeks.  She reports worsening generalized body aches the past several days.  She was seen several days ago, given Phenergan suppositories which initially were working, however due to the diarrhea she is having difficulty using them.  She is not sure if she has had a fever at home.  She denies hematemesis or blood in her stool.  She has not taken anything for pain or fever.  Patient states that if she is not eating or drinking, she then has dry heaves.  She denies history of similar abdominal symptoms.  She reports a history of appendectomy, no other abdominal surgeries.  Patient reports a medical history of diabetes which is mild, thus she does not need to take any medicine for it.  She denies all other medical problems.  No history of IBS or IBD.  She denies sick contacts.  She denies vaginal discharge or urinary symptoms.  HPI  Past Medical History:  Diagnosis Date  . Diabetes (HCC)     There are no active problems to display for this patient.   Past Surgical History:  Procedure Laterality Date  . APPENDECTOMY    . SHOULDER SURGERY       OB History    Gravida  5   Para  3   Term  3   Preterm  0   AB  1   Living  3     SAB  1   TAB  0   Ectopic  0   Multiple  0   Live Births               Home Medications    Prior to Admission medications   Medication Sig Start Date End Date Taking? Authorizing Provider  amoxicillin-clavulanate (AUGMENTIN) 875-125 MG tablet Take 1 tablet by mouth every 12 (twelve) hours. 08/31/18   Edvin Albus, PA-C  benzonatate  (TESSALON) 100 MG capsule Take 1 capsule (100 mg total) by mouth every 8 (eight) hours. Patient not taking: Reported on 11/12/2014 10/24/14   Antony Madura, PA-C  diclofenac (FLECTOR) 1.3 % PTCH Place 1 patch onto the skin 2 (two) times daily. 11/21/14   Palumbo, April, MD  HYDROcodone-acetaminophen (NORCO) 5-325 MG tablet Take 1 tablet by mouth every 6 (six) hours as needed. 06/22/16   Janne Napoleon, NP  HYDROcodone-homatropine [facility]) 5-1.5 MG/5ML syrup Take 5 mLs by mouth every 8 (eight) hours as needed for cough. Patient not taking: Reported on 11/12/2014 10/24/14   Antony Madura, PA-C  loperamide (IMODIUM) 2 MG capsule Take 1 capsule (2 mg total) by mouth 4 (four) times daily as needed for diarrhea or loose stools. 08/31/18   Maizee Reinhold, PA-C  methocarbamol (ROBAXIN) 500 MG tablet Take 1 tablet (500 mg total) by mouth 2 (two) times daily. 11/21/14   Palumbo, April, MD  Phenylephrine-Pheniramine-DM [facility] COLD & COUGH) 06-24-19 MG PACK Take 1 Package by mouth daily as needed (flu-like symptoms).    [provider]  potassium chloride 20 MEQ TBCR Take 20 mEq by mouth daily for 7 days. 08/31/18 09/07/18  Sumayyah Custodio, Jeanette Caprice, PA-C  Prenatal Multivit-Min-Fe-FA (PRENATAL VITAMINS) 0.8 MG tablet Take 1 tablet by mouth daily. Patient not taking: Reported on 10/24/2014 10/29/12   Phillis Haggis, MD  promethazine (PHENERGAN) 25 MG suppository Place 1 suppository (25 mg total) rectally every 8 (eight) hours as needed for nausea or vomiting. 08/31/18   Yavier Snider, PA-C    Family History Family History  Problem Relation Age of Onset  . Cancer Other     Social History Social History   Tobacco Use  . Smoking status: Current Every Day Smoker    Packs/day: 1.00    Types: Cigarettes  . Smokeless tobacco: Never Used  Substance Use Topics  . Alcohol use: No  . Drug use: No    Types: Marijuana     Allergies   Peanut-containing drug products; Ibuprofen [ibuprofen]; Zofran;  Chocolate; and Prednisone   Review of Systems Review of Systems  Gastrointestinal: Positive for diarrhea, nausea and vomiting.  Musculoskeletal: Positive for myalgias.  All other systems reviewed and are negative.    Physical Exam Updated Vital Signs BP 96/62   Pulse 93   Temp 97.9 F (36.6 C) (Oral)   Resp 18   SpO2 99%   Physical Exam Vitals signs and nursing note reviewed.  Constitutional:      General: She is not in acute distress.    Appearance: She is well-developed.     Comments: Patient is writhing in the bed, no vomiting noted.  HENT:     Head: Normocephalic and atraumatic.  Eyes:     Conjunctiva/sclera: Conjunctivae normal.     Pupils: Pupils are equal, round, and reactive to light.  Neck:     Musculoskeletal: Normal range of motion and neck supple.  Cardiovascular:     Rate and Rhythm: Regular rhythm.     Pulses: Normal pulses.     Comments: Mildly tachycardic around 105 Pulmonary:     Effort: Pulmonary effort is normal. No respiratory distress.     Breath sounds: Normal breath sounds. No wheezing.  Abdominal:     General: Bowel sounds are normal. There is no distension.     Palpations: Abdomen is soft. There is no mass.     Tenderness: There is no abdominal tenderness. There is no guarding or rebound.     Comments: No focal abdominal tenderness.  Soft rigidity, guarding, distention.  Negative rebound.  Musculoskeletal: Normal range of motion.  Skin:    General: Skin is warm and dry.     Capillary Refill: Capillary refill takes less than 2 seconds.  Neurological:     Mental Status: She is alert and oriented to person, place, and time.      ED Treatments / Results  Labs (all labs ordered are listed, but only abnormal results are displayed) Labs Reviewed  COMPREHENSIVE METABOLIC PANEL - Abnormal; Notable for the following components:      Result Value   Potassium 2.8 (*)    Calcium 8.7 (*)    Alkaline Phosphatase 37 (*)    All other components  within normal limits  CBC - Abnormal; Notable for the following components:   WBC 3.8 (*)    Platelets 122 (*)    All other components within normal limits  LIPASE, BLOOD  URINALYSIS, ROUTINE W REFLEX MICROSCOPIC    EKG None  Radiology Ct Abdomen Pelvis W Contrast  Result Date: 08/31/2018 CLINICAL DATA:  Nausea, vomiting and diarrhea for 2 weeks. EXAM: CT ABDOMEN AND PELVIS WITH CONTRAST TECHNIQUE: Multidetector CT imaging of the  abdomen and pelvis was performed using the standard protocol following bolus administration of intravenous contrast. CONTRAST:  100mL ISOVUE-300 IOPAMIDOL (ISOVUE-300) INJECTION 61% COMPARISON:  None. FINDINGS: Lower chest: No acute abnormality. Hepatobiliary: There is focal fatty infiltration of the liver at the falciform ligament. The liver is otherwise normal. The gallbladder is normal. The biliary tree is normal. Pancreas: Unremarkable. No pancreatic ductal dilatation or surrounding inflammatory changes. Spleen: Normal in size without focal abnormality. Adrenals/Urinary Tract: Adrenal glands are unremarkable. Kidneys are normal, without renal calculi, focal lesion, or hydronephrosis. Bladder is unremarkable. Stomach/Bowel: Stomach is within normal limits. Patient status post prior appendectomy. No evidence of bowel wall thickening, distention, or inflammatory changes. Vascular/Lymphatic: No significant vascular findings are present. No enlarged abdominal or pelvic lymph nodes. Reproductive: IUD is identified within the uterus. The ovaries are normal for age. Other: Small amount of free fluid is identified in the pelvis likely physiologic. Musculoskeletal: No acute or significant osseous findings. IMPRESSION: No acute abnormality identified in the abdomen pelvis. No bowel obstruction identified. Prior appendectomy. Small amount free fluid is identified in the pelvis, likely physiologic. Electronically Signed   By: Sherian ReinWei-Chen  Lin M.D.   On: 08/31/2018 23:01     Procedures Procedures (including critical care time)  Medications Ordered in ED Medications  iopamidol (ISOVUE-300) 61 % injection 30 mL (has no administration in time range)  0.9 %  sodium chloride infusion (250 mLs Intravenous New Bag/Given 08/31/18 2038)  sodium chloride 0.9 % bolus 1,000 mL (0 mLs Intravenous Stopped 08/31/18 2048)  metoCLOPramide (REGLAN) injection 10 mg (10 mg Intravenous Given 08/31/18 1947)  ketorolac (TORADOL) 15 MG/ML injection 7.5 mg (7.5 mg Intravenous Given 08/31/18 1948)  diphenhydrAMINE (BENADRYL) injection 12.5 mg (12.5 mg Intravenous Given 08/31/18 2004)  potassium chloride 10 mEq in 100 mL IVPB (0 mEq Intravenous Stopped 08/31/18 2259)  promethazine (PHENERGAN) injection 25 mg (25 mg Intravenous Given 08/31/18 2113)  iopamidol (ISOVUE-300) 61 % injection 100 mL (100 mLs Intravenous Contrast Given 08/31/18 2213)     Initial Impression / Assessment and Plan / ED Course  I have reviewed the triage vital signs and the nursing notes.  Pertinent labs & imaging results that were available during my care of the patient were reviewed by me and considered in my medical decision making (see chart for details).     Patient presenting for evaluation of rectal vomiting, diarrhea, generalized body aches.  On upon arrival, patient is febrile and tachycardic.  Tachycardia likely due to fever and dehydration.  As such, will give fluid bolus, Toradol, and Zofran for nausea control.  Considering patient's fever and continued symptoms for an extended course, will obtain labs and imaging for further evaluation.  Informed by RN that patient is complaining of itching. Will give benadryl, although I am not sure if this is a true medication allergy.  HR and temp improved as expected.  Labs show hypokalemia, likely secondary to vomiting and diarrhea.  White count mildly low at 3.8.  Otherwise, labs are reassuring.  Kidney, liver, pancreatic function reassuring.  CT abdomen  pelvis pending.  Patient with continued nausea/vomiting, as such we will give Phenergan reassess.  No further vomiting.  CT abdomen pelvis without acute or concerning findings.  Discussed with patient.  Patient tolerating Sprite without difficulty.  Discussed follow-up with GI for further evaluation.  As symptoms have been present for the past 2 weeks, will treat for possible infectious gastroenteritis.  Nausea diarrheal control given.  Potassium replenished in the ED and oral given  for home.  At this time, patient appears safe for discharge.  Return precautions given.  Patient states she understands and agrees plan.   Final Clinical Impressions(s) / ED Diagnoses   Final diagnoses:  Nausea vomiting and diarrhea  Hypokalemia    ED Discharge Orders         Ordered    loperamide (IMODIUM) 2 MG capsule  4 times daily PRN     08/31/18 2357    amoxicillin-clavulanate (AUGMENTIN) 875-125 MG tablet  Every 12 hours     08/31/18 2357    promethazine (PHENERGAN) 25 MG suppository  Every 8 hours PRN     08/31/18 2357    potassium chloride 20 MEQ TBCR  Daily     08/31/18 2357           Alveria ApleyCaccavale, Dickie Labarre, PA-C 09/01/18 0023    Tilden Fossaees, Elizabeth, MD 09/01/18 1126

## 2018-09-01 NOTE — ED Notes (Signed)
Patient verbalizes understanding of discharge instructions. Opportunity for questioning and answers were provided. Armband removed by staff, pt discharged from ED home via POV.  

## 2018-11-07 ENCOUNTER — Emergency Department (HOSPITAL_BASED_OUTPATIENT_CLINIC_OR_DEPARTMENT_OTHER)
Admission: EM | Admit: 2018-11-07 | Discharge: 2018-11-07 | Disposition: A | Discharge status: Home / Self Care | Payer: Medicaid Other | Attending: Emergency Medicine | Admitting: Emergency Medicine

## 2018-11-07 ENCOUNTER — Other Ambulatory Visit: Payer: Self-pay

## 2018-11-07 ENCOUNTER — Encounter (HOSPITAL_BASED_OUTPATIENT_CLINIC_OR_DEPARTMENT_OTHER): Payer: Self-pay | Admitting: Emergency Medicine

## 2018-11-07 DIAGNOSIS — F1210 Cannabis abuse, uncomplicated: Secondary | ICD-10-CM | POA: Insufficient documentation

## 2018-11-07 DIAGNOSIS — R197 Diarrhea, unspecified: Secondary | ICD-10-CM | POA: Diagnosis not present

## 2018-11-07 DIAGNOSIS — Z91010 Allergy to peanuts: Secondary | ICD-10-CM | POA: Insufficient documentation

## 2018-11-07 DIAGNOSIS — R1084 Generalized abdominal pain: Secondary | ICD-10-CM | POA: Diagnosis not present

## 2018-11-07 DIAGNOSIS — E119 Type 2 diabetes mellitus without complications: Secondary | ICD-10-CM | POA: Insufficient documentation

## 2018-11-07 DIAGNOSIS — F17210 Nicotine dependence, cigarettes, uncomplicated: Secondary | ICD-10-CM | POA: Insufficient documentation

## 2018-11-07 DIAGNOSIS — R112 Nausea with vomiting, unspecified: Secondary | ICD-10-CM | POA: Insufficient documentation

## 2018-11-07 LAB — RAPID URINE DRUG SCREEN, HOSP PERFORMED
Amphetamines: POSITIVE — AB
Barbiturates: NOT DETECTED
Benzodiazepines: NOT DETECTED
Cocaine: NOT DETECTED
Opiates: NOT DETECTED
Tetrahydrocannabinol: POSITIVE — AB

## 2018-11-07 LAB — CBC WITH DIFFERENTIAL/PLATELET
Abs Immature Granulocytes: 0.09 10*3/uL — ABNORMAL HIGH (ref 0.00–0.07)
Basophils Absolute: 0 10*3/uL (ref 0.0–0.1)
Basophils Relative: 0 %
Eosinophils Absolute: 0 10*3/uL (ref 0.0–0.5)
Eosinophils Relative: 0 %
HCT: 43.2 % (ref 36.0–46.0)
Hemoglobin: 14.5 g/dL (ref 12.0–15.0)
Immature Granulocytes: 1 %
Lymphocytes Relative: 3 %
Lymphs Abs: 0.6 10*3/uL — ABNORMAL LOW (ref 0.7–4.0)
MCH: 32.3 pg (ref 26.0–34.0)
MCHC: 33.6 g/dL (ref 30.0–36.0)
MCV: 96.2 fL (ref 80.0–100.0)
Monocytes Absolute: 0.4 10*3/uL (ref 0.1–1.0)
Monocytes Relative: 2 %
Neutro Abs: 18.1 10*3/uL — ABNORMAL HIGH (ref 1.7–7.7)
Neutrophils Relative %: 94 %
Platelets: 268 10*3/uL (ref 150–400)
RBC: 4.49 MIL/uL (ref 3.87–5.11)
RDW: 14 % (ref 11.5–15.5)
WBC: 19.2 10*3/uL — ABNORMAL HIGH (ref 4.0–10.5)
nRBC: 0 % (ref 0.0–0.2)

## 2018-11-07 LAB — COMPREHENSIVE METABOLIC PANEL
ALT: 21 U/L (ref 0–44)
AST: 25 U/L (ref 15–41)
Albumin: 4.3 g/dL (ref 3.5–5.0)
Alkaline Phosphatase: 51 U/L (ref 38–126)
Anion gap: 7 (ref 5–15)
BUN: 17 mg/dL (ref 6–20)
CO2: 24 mmol/L (ref 22–32)
CREATININE: 0.78 mg/dL (ref 0.44–1.00)
Calcium: 8.8 mg/dL — ABNORMAL LOW (ref 8.9–10.3)
Chloride: 107 mmol/L (ref 98–111)
GFR calc Af Amer: >60 mL/min (ref >60)
GFR calc non Af Amer: >60 mL/min (ref >60)
Glucose, Bld: 128 mg/dL — ABNORMAL HIGH (ref 70–99)
Potassium: 3.9 mmol/L (ref 3.5–5.1)
Sodium: 138 mmol/L (ref 135–145)
Total Bilirubin: 1 mg/dL (ref 0.3–1.2)
Total Protein: 7.3 g/dL (ref 6.5–8.1)

## 2018-11-07 LAB — PREGNANCY, URINE: Preg Test, Ur: NEGATIVE

## 2018-11-07 MED ORDER — ONDANSETRON HCL 4 MG/2ML IJ SOLN
4.0000 mg | Freq: Once | INTRAMUSCULAR | Status: AC
  Administered 2018-11-07: 4 mg via INTRAVENOUS

## 2018-11-07 MED ORDER — DIPHENHYDRAMINE HCL 50 MG/ML IJ SOLN
25.0000 mg | Freq: Once | INTRAMUSCULAR | Status: AC
  Administered 2018-11-07: 25 mg via INTRAVENOUS

## 2018-11-07 MED ORDER — PROMETHAZINE HCL 25 MG PO TABS
25.0000 mg | ORAL_TABLET | ORAL | Status: AC
  Administered 2018-11-07: 25 mg via ORAL
  Filled 2018-11-07: qty 1

## 2018-11-07 MED ORDER — HALOPERIDOL LACTATE 5 MG/ML IJ SOLN
2.0000 mg | Freq: Once | INTRAMUSCULAR | Status: DC
  Filled 2018-11-07: qty 1

## 2018-11-07 MED ORDER — PROCHLORPERAZINE 25 MG RE SUPP
RECTAL | Status: AC
  Filled 2018-11-07: qty 1

## 2018-11-07 MED ORDER — PROMETHAZINE HCL 25 MG RE SUPP: 25.0000 mg | Freq: Three times a day (TID) | RECTAL | 0 refills | Status: DC | PRN

## 2018-11-07 MED ORDER — ONDANSETRON HCL 4 MG/2ML IJ SOLN
INTRAMUSCULAR | Status: AC
  Administered 2018-11-07: 4 mg via INTRAVENOUS
  Filled 2018-11-07: qty 2

## 2018-11-07 MED ORDER — DIPHENHYDRAMINE HCL 50 MG/ML IJ SOLN
INTRAMUSCULAR | Status: AC
  Filled 2018-11-07: qty 1

## 2018-11-07 MED ORDER — PROMETHAZINE HCL 25 MG RE SUPP
25.0000 mg | Freq: Once | RECTAL | Status: DC
  Filled 2018-11-07: qty 1

## 2018-11-07 NOTE — ED Notes (Signed)
ED Provider at bedside. 

## 2018-11-07 NOTE — ED Notes (Signed)
Patient given zofran-initially stated she could take without issues.  After administration patient states she is allergic to zofran as it causes itching.  EDP made aware. Benadryl ordered.

## 2018-11-07 NOTE — ED Provider Notes (Signed)
[facility] EMERGENCY DEPARTMENT Provider Note   CSN: [id number] Arrival date & time: 11/07/18  1056    History   Chief Complaint Chief Complaint  Patient presents with  . Emesis  . Diarrhea    HPI Kayla Diaz is a 33 y.o. female.     33 year old female with past medical history below who presents with nausea, vomiting, and diarrhea.  Around 1 AM this morning, she woke up with sudden onset of vomiting followed by multiple episodes of diarrhea.  She has not been able to hold anything down and she continues to have dry heaves.  She reports generalized abdominal pain/cramping.  She denies any fevers, URI symptoms, sick contacts, or recent travel.  She uses occasional marijuana, last use was last week.  No medications prior to arrival.  The history is provided by the patient.  Emesis  Associated symptoms: diarrhea   Diarrhea  Associated symptoms: vomiting     Past Medical History:  Diagnosis Date  . Diabetes (HCC)     There are no active problems to display for this patient.   Past Surgical History:  Procedure Laterality Date  . APPENDECTOMY    . SHOULDER SURGERY       OB History    Gravida  5   Para  3   Term  3   Preterm  0   AB  1   Living  3     SAB  1   TAB  0   Ectopic  0   Multiple  0   Live Births               Home Medications    Prior to Admission medications   Medication Sig Start Date End Date Taking? Authorizing Provider  promethazine (PHENERGAN) 25 MG suppository Place 1 suppository (25 mg total) rectally every 8 (eight) hours as needed for nausea or vomiting. 11/07/18   , Ambrose Finland, MD    Family History Family History  Problem Relation Age of Onset  . Cancer Other     Social History Social History   Tobacco Use  . Smoking status: Current Every Day Smoker    Packs/day: 1.00    Types: Cigarettes  . Smokeless tobacco: Never Used  Substance Use Topics  . Alcohol use: No  . Drug use: Yes      Types: Marijuana     Allergies   Peanut-containing drug products; Ibuprofen [ibuprofen]; Zofran; Chocolate; and Prednisone   Review of Systems Review of Systems  Gastrointestinal: Positive for diarrhea and vomiting.   All other systems reviewed and are negative except that which was mentioned in HPI   Physical Exam Updated Vital Signs BP 112/68   Pulse 80   Temp 98.2 F (36.8 C) (Oral)   Resp 16   Ht 5\' 4"  (1.626 m)   Wt 56.7 kg   SpO2 100%   BMI 21.46 kg/m   Physical Exam Vitals signs and nursing note reviewed.  Constitutional:      General: She is not in acute distress.    Appearance: She is well-developed.     Comments: Uncomfortable, curled up in bed  HENT:     Head: Normocephalic and atraumatic.     Nose: Nose normal.     Mouth/Throat:     Mouth: Mucous membranes are moist.     Pharynx: Oropharynx is clear.  Eyes:     Conjunctiva/sclera: Conjunctivae normal.  Neck:     Musculoskeletal: Neck supple.  Cardiovascular:     Rate and Rhythm: Normal rate and regular rhythm.     Heart sounds: Normal heart sounds. No murmur.  Pulmonary:     Effort: Pulmonary effort is normal.     Breath sounds: Normal breath sounds.  Abdominal:     General: Bowel sounds are normal. There is no distension.     Palpations: Abdomen is soft.     Tenderness: There is abdominal tenderness (generalized). There is no guarding or rebound.  Skin:    General: Skin is warm and dry.  Neurological:     Mental Status: She is alert and oriented to person, place, and time.     Comments: Fluent speech  Psychiatric:        Judgment: Judgment normal.     Comments: Anxious, distressed      ED Treatments / Results  Labs (all labs ordered are listed, but only abnormal results are displayed) Labs Reviewed  RAPID URINE DRUG SCREEN, HOSP PERFORMED - Abnormal; Notable for the following components:      Result Value   Amphetamines POSITIVE (*)    Tetrahydrocannabinol POSITIVE (*)    All  other components within normal limits  COMPREHENSIVE METABOLIC PANEL - Abnormal; Notable for the following components:   Glucose, Bld 128 (*)    Calcium 8.8 (*)    All other components within normal limits  CBC WITH DIFFERENTIAL/PLATELET - Abnormal; Notable for the following components:   WBC 19.2 (*)    Neutro Abs 18.1 (*)    Lymphs Abs 0.6 (*)    Abs Immature Granulocytes 0.09 (*)    All other components within normal limits  PREGNANCY, URINE    EKG None  Radiology No results found.  Procedures Procedures (including critical care time)  Medications Ordered in ED Medications  ondansetron (ZOFRAN) injection 4 mg (4 mg Intravenous Given 11/07/18 1120)  diphenhydrAMINE (BENADRYL) injection 25 mg (25 mg Intravenous Given 11/07/18 1122)  promethazine (PHENERGAN) tablet 25 mg (25 mg Oral Given 11/07/18 1323)     Initial Impression / Assessment and Plan / ED Course  I have reviewed the triage vital signs and the nursing notes.  Pertinent labs that were available during my care of the patient were reviewed by me and considered in my medical decision making (see chart for details).        Normal vital signs, generalized abdominal tenderness on exam without rebound.  She initially stated no allergies therefore Zofran was given and after it was given she then stated she had an allergy to it.  She complained of burning to her arm, had no rash on exam.  Benadryl was given.  No signs or symptoms of anaphylaxis.  She continued to complain of severe nausea, ordered Haldol but patient refused at that and multiple other medications stating that she was scared to try any new medications as she has history of allergies to multiple medications.  She initially requested IV Phenergan, then PR Phenergan which we do not carry here.  Finally agreed to taking oral Phenergan.  On reassessment, patient was resting comfortably.  She was able to tolerate ice chips and Coke with no problems and stated that she  felt much better and wanted to go home.  Lab work notable for normal CMP, WBC 19.2, negative UA, UDS positive for amphetamines and THC.  I discussed her elevated white count and possibility of infectious process.  Because she is improved, has both vomiting and diarrhea, and feels better with tolerating p.o.,  I do not feel she needs CT scan at this time but I have emphasized return precautions including any worsening symptoms or severe abdominal pain.  She voiced understanding.  Final Clinical Impressions(s) / ED Diagnoses   Final diagnoses:  Nausea vomiting and diarrhea    ED Discharge Orders         Ordered    promethazine (PHENERGAN) 25 MG suppository  Every 8 hours PRN     11/07/18 1602           , Ambrose Finland, MD 11/07/18 [phone]

## 2018-11-07 NOTE — ED Notes (Signed)
No rash noted to arm.  Patient states she is feeling better after benadryl administration.  Patient denies any shortness of breath, itching.

## 2018-11-07 NOTE — ED Notes (Addendum)
We do not have Phenergan Suppositories.  That is what pt wants.  Pharmacist was called to see if the tabs can be given rectally but there is no indication that it can be given rectally.  Pt refusing Compazine suppositories or any other form because she has never had it and is "afraid."  Pt asking only for Phenergan IV because she knows it works and she knows she is not allergic to it.  Pt crying and saying that we are making her suffer by not giving her something that works and she just wants to feel better.  Pt continues to dry heave.  Requesting water so she has something in her stomach that she can actually vomit.

## 2018-11-07 NOTE — ED Triage Notes (Addendum)
Pt reports N/V/D since this am. States last smoked marijuana last week.

## 2018-11-07 NOTE — ED Notes (Signed)
Patient reports relief from itching. 

## 2018-11-07 NOTE — ED Notes (Signed)
Drinking fluids well

## 2018-11-07 NOTE — ED Notes (Signed)
PT unable to give urine sample at this time. Sips of po fluids given.

## 2018-11-07 NOTE — ED Notes (Signed)
Pt refused Haldol stating that she had never had it before and was afraid to try it since she is allergic to some meds.  Requests "something that I've had before that goes in my butt so I can't throw it up."  MD notified.

## 2018-12-24 DIAGNOSIS — M5412 Radiculopathy, cervical region: Secondary | ICD-10-CM | POA: Diagnosis not present

## 2018-12-24 DIAGNOSIS — M25512 Pain in left shoulder: Secondary | ICD-10-CM | POA: Diagnosis not present

## 2020-01-09 ENCOUNTER — Other Ambulatory Visit: Payer: Self-pay

## 2020-01-09 ENCOUNTER — Ambulatory Visit
Admission: EM | Admit: 2020-01-09 | Discharge: 2020-01-09 | Disposition: A | Discharge status: Home / Self Care | Payer: Medicaid Other | Attending: Emergency Medicine | Admitting: Emergency Medicine

## 2020-01-09 ENCOUNTER — Encounter: Payer: Self-pay | Admitting: Emergency Medicine

## 2020-01-09 ENCOUNTER — Ambulatory Visit (INDEPENDENT_AMBULATORY_CARE_PROVIDER_SITE_OTHER): Payer: Medicaid Other

## 2020-01-09 DIAGNOSIS — J40 Bronchitis, not specified as acute or chronic: Secondary | ICD-10-CM

## 2020-01-09 DIAGNOSIS — R05 Cough: Secondary | ICD-10-CM

## 2020-01-09 MED ORDER — AEROCHAMBER PLUS FLO-VU MEDIUM MISC: 1.0000 | Freq: Once | 0 refills | Status: AC

## 2020-01-09 MED ORDER — ALBUTEROL SULFATE HFA 108 (90 BASE) MCG/ACT IN AERS: 2.0000 | INHALATION_SPRAY | RESPIRATORY_TRACT | 0 refills | Status: DC | PRN

## 2020-01-09 MED ORDER — BENZONATATE 100 MG PO CAPS: 100.0000 mg | ORAL_CAPSULE | Freq: Three times a day (TID) | ORAL | 0 refills | Status: DC

## 2020-01-09 MED ORDER — PREDNISONE 20 MG PO TABS: 20.0000 mg | ORAL_TABLET | Freq: Every day | ORAL | 0 refills | Status: DC

## 2020-01-09 MED ORDER — CETIRIZINE HCL 10 MG PO TABS: 10.0000 mg | ORAL_TABLET | Freq: Every day | ORAL | 0 refills | Status: DC

## 2020-01-09 NOTE — Discharge Instructions (Signed)

## 2020-01-09 NOTE — ED Triage Notes (Signed)
Pt c/o non productive cough since this weekend, states she was around people with strep throat but says throat does not hurt.

## 2020-01-09 NOTE — ED Provider Notes (Signed)
[facility]    CSN: [id number] Arrival date & time: 01/09/20  1659      History   Chief Complaint Chief Complaint  Patient presents with  . Cough    HPI Kayla Diaz is a 34 y.o. female presenting for greater than 2-week course of cough.  States she has had increase sputum production without blood, wheezing, difficulty breathing, chest pain, palpitations.  No fever, chills, arthralgias, myalgias.  Patient states that she was around a friend's children this weekend who were found to have strep.  No sore throat, change in appetite, abdominal pain, vomiting, diarrhea.  Has not taken anything for symptoms as she "does not like medications ".   Past Medical History:  Diagnosis Date  . Diabetes ([place])     There are no problems to display for this patient.   Past Surgical History:  Procedure Laterality Date  . APPENDECTOMY    . SHOULDER SURGERY      OB History    Gravida  5   Para  3   Term  3   Preterm  0   AB  1   Living  3     SAB  1   TAB  0   Ectopic  0   Multiple  0   Live Births               Home Medications    Prior to Admission medications   Medication Sig Start Date End Date Taking? Authorizing Provider  albuterol (VENTOLIN HFA) 108 (90 Base) MCG/ACT inhaler Inhale 2 puffs into the lungs every 4 (four) hours as needed for wheezing or shortness of breath. 01/09/20   Hall-Potvin, [place], PA-C  benzonatate (TESSALON) 100 MG capsule Take 1 capsule (100 mg total) by mouth every 8 (eight) hours. 01/09/20   Hall-Potvin, [place], PA-C  cetirizine (ZYRTEC ALLERGY) 10 MG tablet Take 1 tablet (10 mg total) by mouth daily. 01/09/20   Hall-Potvin, [place], PA-C  predniSONE (DELTASONE) 20 MG tablet Take 1 tablet (20 mg total) by mouth daily. 01/09/20   Hall-Potvin, [place], PA-C  Spacer/Aero-Holding Chambers (AEROCHAMBER PLUS FLO-VU MEDIUM) MISC 1 each by Other route once for 1 dose. 01/09/20 01/09/20  Hall-Potvin, [place], PA-C    promethazine (PHENERGAN) 25 MG suppository Place 1 suppository (25 mg total) rectally every 8 (eight) hours as needed for nausea or vomiting. 11/07/18 01/09/20  Little, Wenda Overland, MD    Family History Family History  Problem Relation Age of Onset  . Cancer Other     Social History Social History   Tobacco Use  . Smoking status: Current Every Day Smoker    Packs/day: 1.00    Types: Cigarettes  . Smokeless tobacco: Never Used  Substance Use Topics  . Alcohol use: No  . Drug use: Yes    Types: Marijuana     Allergies   Peanut-containing drug products, Ibuprofen [ibuprofen], Zofran, Chocolate, and Prednisone   Review of Systems As per HPI   Physical Exam Triage Vital Signs ED Triage Vitals [01/09/20 1705]  Enc Vitals Group     BP 115/78     Pulse Rate 97     Resp 16     Temp 98.4 F (36.9 C)     Temp src      SpO2 98 %     Weight      Height      Head Circumference      Peak Flow      Pain  Score 0     Pain Loc      Pain Edu?      Excl. in GC?    No data found.  Updated Vital Signs BP 115/78   Pulse 97   Temp 98.4 F (36.9 C)   Resp 16   LMP  (LMP Unknown)   SpO2 98%   Visual Acuity Right Eye Distance:   Left Eye Distance:   Bilateral Distance:    Right Eye Near:   Left Eye Near:    Bilateral Near:     Physical Exam Constitutional:      General: She is not in acute distress.    Appearance: She is obese. She is not ill-appearing or diaphoretic.  HENT:     Head: Normocephalic and atraumatic.     Mouth/Throat:     Mouth: Mucous membranes are moist.     Pharynx: Oropharynx is clear. No oropharyngeal exudate or posterior oropharyngeal erythema.  Eyes:     General: No scleral icterus.    Conjunctiva/sclera: Conjunctivae normal.     Pupils: Pupils are equal, round, and reactive to light.  Neck:     Comments: Trachea midline, negative JVD Cardiovascular:     Rate and Rhythm: Normal rate and regular rhythm.     Heart sounds: No murmur. No  gallop.   Pulmonary:     Effort: Pulmonary effort is normal. No respiratory distress.     Breath sounds: No wheezing, rhonchi or rales.  Musculoskeletal:     Cervical back: Neck supple. No tenderness.  Lymphadenopathy:     Cervical: No cervical adenopathy.  Skin:    Capillary Refill: Capillary refill takes less than 2 seconds.     Coloration: Skin is not jaundiced or pale.     Findings: No rash.  Neurological:     General: No focal deficit present.     Mental Status: She is alert and oriented to person, place, and time.      UC Treatments / Results  Labs (all labs ordered are listed, but only abnormal results are displayed) Labs Reviewed - No data to display  EKG   Radiology DG Chest 2 View  Result Date: 01/09/2020 CLINICAL DATA:  Nonproductive cough EXAM: CHEST - 2 VIEW COMPARISON:  10/24/2014 FINDINGS: The heart size and mediastinal contours are within normal limits. Both lungs are clear. The visualized skeletal structures are unremarkable. IMPRESSION: No active cardiopulmonary disease. Electronically Signed   By: Jasmine Pang M.D.   On: 01/09/2020 17:42    Procedures Procedures (including critical care time)  Medications Ordered in UC Medications - No data to display  Initial Impression / Assessment and Plan / UC Course  I have reviewed the triage vital signs and the nursing notes.  Pertinent labs & imaging results that were available during my care of the patient were reviewed by me and considered in my medical decision making (see chart for details).     Patient afebrile, nontoxic, without respiratory distress today.  Patient endorsing symptoms greater than 2 weeks: Covid testing deferred.  Chest x-ray done in office, reviewed by me and radiology and compared to previous from 10/24/2014: Negative for active cardiopulmonary disease.  Reviewed findings with patient verbalized understanding.  Will treat for bronchitis supportively as outlined below.  Return precautions  discussed, patient verbalized understanding and is agreeable to plan. Final Clinical Impressions(s) / UC Diagnoses   Final diagnoses:  Bronchitis     Discharge Instructions     Tessalon for cough. Start  flonase, atrovent nasal spray for nasal congestion/drainage. You can use over the counter nasal saline rinse such as neti pot for nasal congestion. Keep hydrated, your urine should be clear to pale yellow in color. Tylenol/motrin for fever and pain. Monitor for any worsening of symptoms, chest pain, shortness of breath, wheezing, swelling of the throat, go to the emergency department for further evaluation needed.     ED Prescriptions    Medication Sig Dispense Auth. Provider   albuterol (VENTOLIN HFA) 108 (90 Base) MCG/ACT inhaler Inhale 2 puffs into the lungs every 4 (four) hours as needed for wheezing or shortness of breath. 18 g Hall-Potvin, [place], PA-C   Spacer/Aero-Holding Chambers (AEROCHAMBER PLUS FLO-VU MEDIUM) MISC 1 each by Other route once for 1 dose. 1 each Hall-Potvin, [place], PA-C   benzonatate (TESSALON) 100 MG capsule Take 1 capsule (100 mg total) by mouth every 8 (eight) hours. 21 capsule Hall-Potvin, [place], PA-C   cetirizine (ZYRTEC ALLERGY) 10 MG tablet Take 1 tablet (10 mg total) by mouth daily. 30 tablet Hall-Potvin, [place], PA-C   predniSONE (DELTASONE) 20 MG tablet Take 1 tablet (20 mg total) by mouth daily. 5 tablet Hall-Potvin, [place], PA-C     PDMP not reviewed this encounter.   Hall-Potvin, [place], [place] 01/09/20 1746

## 2020-02-29 ENCOUNTER — Ambulatory Visit
Admission: EM | Admit: 2020-02-29 | Discharge: 2020-02-29 | Disposition: A | Discharge status: Home / Self Care | Payer: Medicaid Other | Attending: Physician Assistant | Admitting: Physician Assistant

## 2020-02-29 ENCOUNTER — Other Ambulatory Visit: Payer: Self-pay

## 2020-02-29 DIAGNOSIS — N3090 Cystitis, unspecified without hematuria: Secondary | ICD-10-CM | POA: Diagnosis not present

## 2020-02-29 LAB — POCT URINALYSIS DIP (MANUAL ENTRY)
Bilirubin, UA: NEGATIVE
Glucose, UA: 100 mg/dL — AB
Nitrite, UA: NEGATIVE
Protein Ur, POC: >=300 mg/dL — AB
Spec Grav, UA: >=1.03 — AB (ref 1.010–1.025)
Urobilinogen, UA: 1 E.U./dL
pH, UA: 7 (ref 5.0–8.0)

## 2020-02-29 MED ORDER — CEPHALEXIN 500 MG PO CAPS: 500.0000 mg | ORAL_CAPSULE | Freq: Two times a day (BID) | ORAL | 0 refills | Status: DC

## 2020-02-29 NOTE — ED Provider Notes (Signed)
[facility]    CSN: [id number] Arrival date & time: 02/29/20  1415      History   Chief Complaint Chief Complaint  Patient presents with  . Urinary Frequency  . Dysuria    HPI Kayla Diaz is a 34 y.o. female.   34 year old female comes in for 1 week history of urinary symptoms. Freueqncy, urgency, dysuria. Denies abdominal pain, nausea, vomiting. Denies fever, chills, flank/back pain. Denies vaginal discharge, itching, spotting. IUD      Past Medical History:  Diagnosis Date  . Diabetes ([facility])     There are no problems to display for this patient.   Past Surgical History:  Procedure Laterality Date  . APPENDECTOMY    . SHOULDER SURGERY      OB History    Gravida  5   Para  3   Term  3   Preterm  0   AB  1   Living  3     SAB  1   TAB  0   Ectopic  0   Multiple  0   Live Births               Home Medications    Prior to Admission medications   Medication Sig Start Date End Date Taking? Authorizing Provider  albuterol (VENTOLIN HFA) 108 (90 Base) MCG/ACT inhaler Inhale 2 puffs into the lungs every 4 (four) hours as needed for wheezing or shortness of breath. 01/09/20   Hall-Potvin, [place], PA-C  cephALEXin (KEFLEX) 500 MG capsule Take 1 capsule (500 mg total) by mouth 2 (two) times daily. 02/29/20   Cathie Hoops, Fadia Marlar V, PA-C  cetirizine (ZYRTEC ALLERGY) 10 MG tablet Take 1 tablet (10 mg total) by mouth daily. 01/09/20 02/29/20  Hall-Potvin, [place], PA-C  promethazine (PHENERGAN) 25 MG suppository Place 1 suppository (25 mg total) rectally every 8 (eight) hours as needed for nausea or vomiting. 11/07/18 01/09/20  Little, Ambrose [place], MD    Family History Family History  Problem Relation Age of Onset  . Cancer Other     Social History Social History   Tobacco Use  . Smoking status: Current Every Day Smoker    Packs/day: 1.00    Types: Cigarettes  . Smokeless tobacco: Never Used  Substance Use Topics  . Alcohol use: No    . Drug use: Yes    Types: Marijuana     Allergies   Peanut-containing drug products, Ibuprofen [ibuprofen], Zofran, Chocolate, and Prednisone   Review of Systems Review of Systems  Reason unable to perform ROS: See HPI as above.     Physical Exam Triage Vital Signs ED Triage Vitals  Enc Vitals Group     BP 02/29/20 1430 103/67     Pulse Rate 02/29/20 1430 80     Resp 02/29/20 1430 13     Temp 02/29/20 1430 98.2 F (36.8 C)     Temp Source 02/29/20 1430 Oral     SpO2 02/29/20 1430 98 %     Weight --      Height --      Head Circumference --      Peak Flow --      Pain Score 02/29/20 1431 8     Pain Loc --      Pain Edu? --      Excl. in GC? --    No data found.  Updated Vital Signs BP 103/67 (BP Location: Right Arm)   Pulse 80  Temp 98.2 F (36.8 C) (Oral)   Resp 13   SpO2 98%   Physical Exam Constitutional:      General: She is not in acute distress.    Appearance: She is well-developed. She is not ill-appearing, toxic-appearing or diaphoretic.  HENT:     Head: Normocephalic and atraumatic.  Eyes:     Conjunctiva/sclera: Conjunctivae normal.     Pupils: Pupils are equal, round, and reactive to light.  Cardiovascular:     Rate and Rhythm: Normal rate and regular rhythm.  Pulmonary:     Effort: Pulmonary effort is normal. No respiratory distress.     Comments: LCTAB Abdominal:     General: Bowel sounds are normal.     Palpations: Abdomen is soft.     Tenderness: There is no abdominal tenderness. There is no right CVA tenderness, left CVA tenderness, guarding or rebound.  Musculoskeletal:     Cervical back: Normal range of motion and neck supple.  Skin:    General: Skin is warm and dry.  Neurological:     Mental Status: She is alert and oriented to person, place, and time.  Psychiatric:        Behavior: Behavior normal.        Judgment: Judgment normal.      UC Treatments / Results  Labs (all labs ordered are listed, but only abnormal  results are displayed) Labs Reviewed  POCT URINALYSIS DIP (MANUAL ENTRY) - Abnormal; Notable for the following components:      Result Value   Clarity, UA cloudy (*)    Glucose, UA =100 (*)    Ketones, POC UA trace (5) (*)    Spec Grav, UA >=1.030 (*)    Blood, UA moderate (*)    Protein Ur, POC >=300 (*)    Leukocytes, UA Moderate (2+) (*)    All other components within normal limits  URINE CULTURE    EKG   Radiology No results found.  Procedures Procedures (including critical care time)  Medications Ordered in UC Medications - No data to display  Initial Impression / Assessment and Plan / UC Course  I have reviewed the triage vital signs and the nursing notes.  Pertinent labs & imaging results that were available during my care of the patient were reviewed by me and considered in my medical decision making (see chart for details).    Urine dipstick positive for UTI. Start antibiotics as directed. Push fluids. Return precautions given.  Final Clinical Impressions(s) / UC Diagnoses   Final diagnoses:  Cystitis   ED Prescriptions    Medication Sig Dispense Auth. Provider   cephALEXin (KEFLEX) 500 MG capsule Take 1 capsule (500 mg total) by mouth 2 (two) times daily. 10 capsule Ok Edwards, PA-C     PDMP not reviewed this encounter.   Ok Edwards, PA-C 02/29/20 1453

## 2020-02-29 NOTE — ED Triage Notes (Signed)
Patient presents with a c/o urinary frequency and urgency x one week.

## 2020-02-29 NOTE — Discharge Instructions (Signed)
Your urine was positive for an urinary tract infection. Start keflex as directed. Keep hydrated, urine should be clear to pale yellow in color. Monitor for any worsening of symptoms, fever, worsening abdominal pain, nausea/vomiting, flank pain, follow up for reevaluation.  ° °

## 2020-03-03 LAB — URINE CULTURE: Culture: 80000 — AB

## 2020-07-18 ENCOUNTER — Other Ambulatory Visit: Payer: Self-pay

## 2020-07-18 ENCOUNTER — Ambulatory Visit
Admission: EM | Admit: 2020-07-18 | Discharge: 2020-07-18 | Disposition: A | Discharge status: Home / Self Care | Payer: Medicaid Other | Attending: Emergency Medicine | Admitting: Emergency Medicine

## 2020-07-18 ENCOUNTER — Encounter: Payer: Self-pay | Admitting: Emergency Medicine

## 2020-07-18 ENCOUNTER — Ambulatory Visit (INDEPENDENT_AMBULATORY_CARE_PROVIDER_SITE_OTHER): Payer: Medicaid Other

## 2020-07-18 DIAGNOSIS — J209 Acute bronchitis, unspecified: Secondary | ICD-10-CM

## 2020-07-18 DIAGNOSIS — R059 Cough, unspecified: Secondary | ICD-10-CM | POA: Diagnosis not present

## 2020-07-18 DIAGNOSIS — R0989 Other specified symptoms and signs involving the circulatory and respiratory systems: Secondary | ICD-10-CM

## 2020-07-18 MED ORDER — CETIRIZINE HCL 10 MG PO TABS: 10.0000 mg | ORAL_TABLET | Freq: Every day | ORAL | 0 refills | Status: DC

## 2020-07-18 MED ORDER — GUAIFENESIN-DM 100-10 MG/5ML PO SYRP: 5.0000 mL | ORAL_SOLUTION | ORAL | 0 refills | Status: DC | PRN

## 2020-07-18 MED ORDER — ALBUTEROL SULFATE HFA 108 (90 BASE) MCG/ACT IN AERS: 2.0000 | INHALATION_SPRAY | RESPIRATORY_TRACT | 0 refills | Status: DC | PRN

## 2020-07-18 MED ORDER — PREDNISONE 20 MG PO TABS: 20.0000 mg | ORAL_TABLET | Freq: Every day | ORAL | 0 refills | Status: DC

## 2020-07-18 NOTE — ED Provider Notes (Signed)
[facility]    CSN: [id number] Arrival date & time: 07/18/20  1221      History   Chief Complaint Chief Complaint  Patient presents with  . Cough    HPI Kayla Diaz is a 34 y.o. female  With history as below presenting for possible bronchitis.  Endorsing 2-week course of mildly productive cough without shortness of breath, chest pain.  Last seen for this by me on 01/09/2020: Given supportive care including inhaler, Tessalon, prednisone.  Patient tolerated prednisone well, though states Tessalon did not help.  No known sick contacts, fever.  Past Medical History:  Diagnosis Date  . Diabetes ([facility])     There are no problems to display for this patient.   Past Surgical History:  Procedure Laterality Date  . APPENDECTOMY    . SHOULDER SURGERY      OB History    Gravida  5   Para  3   Term  3   Preterm  0   AB  1   Living  3     SAB  1   TAB  0   Ectopic  0   Multiple  0   Live Births               Home Medications    Prior to Admission medications   Medication Sig Start Date End Date Taking? Authorizing Provider  albuterol (VENTOLIN HFA) 108 (90 Base) MCG/ACT inhaler Inhale 2 puffs into the lungs every 4 (four) hours as needed for wheezing or shortness of breath. 07/18/20   Hall-Potvin, [place], PA-C  cetirizine (ZYRTEC ALLERGY) 10 MG tablet Take 1 tablet (10 mg total) by mouth daily. 07/18/20   Hall-Potvin, [place], PA-C  guaiFENesin-dextromethorphan (ROBITUSSIN DM) 100-10 MG/5ML syrup Take 5 mLs by mouth every 4 (four) hours as needed for cough. 07/18/20   Hall-Potvin, [place], PA-C  predniSONE (DELTASONE) 20 MG tablet Take 1 tablet (20 mg total) by mouth daily. 07/18/20   Hall-Potvin, [place], PA-C  promethazine (PHENERGAN) 25 MG suppository Place 1 suppository (25 mg total) rectally every 8 (eight) hours as needed for nausea or vomiting. 11/07/18 01/09/20  Little, Ambrose [place], MD    Family History Family History   Problem Relation Age of Onset  . Cancer Other     Social History Social History   Tobacco Use  . Smoking status: Current Every Day Smoker    Packs/day: 1.00    Types: Cigarettes  . Smokeless tobacco: Never Used  Substance Use Topics  . Alcohol use: No  . Drug use: Yes    Types: Marijuana     Allergies   Peanut-containing drug products, Ibuprofen [ibuprofen], Zofran, Chocolate, and Prednisone   Review of Systems Review of Systems  Constitutional: Negative for fatigue and fever.  HENT: Negative for congestion, dental problem, ear pain, facial swelling, hearing loss, sinus pain, sore throat, trouble swallowing and voice change.   Eyes: Negative for photophobia, pain, redness and visual disturbance.  Respiratory: Positive for cough and wheezing. Negative for shortness of breath.   Cardiovascular: Negative for chest pain and palpitations.  Gastrointestinal: Negative for abdominal pain, diarrhea and vomiting.  Musculoskeletal: Negative for arthralgias and myalgias.  Skin: Negative for rash and wound.  Neurological: Negative for dizziness, syncope and headaches.     Physical Exam Triage Vital Signs ED Triage Vitals  Enc Vitals Group     BP 07/18/20 1233 103/69     Pulse Rate 07/18/20 1233 100  Resp 07/18/20 1233 18     Temp 07/18/20 1233 98.3 F (36.8 C)     Temp Source 07/18/20 1233 Oral     SpO2 07/18/20 1233 98 %     Weight --      Height --      Head Circumference --      Peak Flow --      Pain Score 07/18/20 1234 4     Pain Loc --      Pain Edu? --      Excl. in GC? --    No data found.  Updated Vital Signs BP 103/69 (BP Location: Left Arm)   Pulse 100   Temp 98.3 F (36.8 C) (Oral)   Resp 18   SpO2 98%   Visual Acuity Right Eye Distance:   Left Eye Distance:   Bilateral Distance:    Right Eye Near:   Left Eye Near:    Bilateral Near:     Physical Exam Constitutional:      General: She is not in acute distress.    Appearance: She is not  ill-appearing or diaphoretic.  HENT:     Head: Normocephalic and atraumatic.     Mouth/Throat:     Mouth: Mucous membranes are moist.     Pharynx: Oropharynx is clear. No oropharyngeal exudate or posterior oropharyngeal erythema.  Eyes:     General: No scleral icterus.    Conjunctiva/sclera: Conjunctivae normal.     Pupils: Pupils are equal, round, and reactive to light.  Neck:     Comments: Trachea midline, negative JVD Cardiovascular:     Rate and Rhythm: Normal rate and regular rhythm.     Heart sounds: No murmur heard.  No gallop.   Pulmonary:     Effort: Pulmonary effort is normal. No respiratory distress.     Breath sounds: Rhonchi present. No wheezing or rales.     Comments: Scattered.  No prolonged exp phast Musculoskeletal:     Cervical back: Neck supple. No tenderness.  Lymphadenopathy:     Cervical: No cervical adenopathy.  Skin:    Capillary Refill: Capillary refill takes less than 2 seconds.     Coloration: Skin is not jaundiced or pale.     Findings: No rash.  Neurological:     General: No focal deficit present.     Mental Status: She is alert and oriented to person, place, and time.      UC Treatments / Results  Labs (all labs ordered are listed, but only abnormal results are displayed) Labs Reviewed - No data to display  EKG   Radiology DG Chest 2 View  Result Date: 07/18/2020 CLINICAL DATA:  Cough and congestion EXAM: CHEST - 2 VIEW COMPARISON:  Jan 09, 2020 FINDINGS: The lungs are clear. The heart size and pulmonary vascularity are normal. No adenopathy. No bone lesions. IMPRESSION: Lungs clear.  Cardiac silhouette normal. Electronically Signed   By: Bretta Bang III M.D.   On: 07/18/2020 13:29    Procedures Procedures (including critical care time)  Medications Ordered in UC Medications - No data to display  Initial Impression / Assessment and Plan / UC Course  I have reviewed the triage vital signs and the nursing notes.  Pertinent  labs & imaging results that were available during my care of the patient were reviewed by me and considered in my medical decision making (see chart for details).     Patient without respiratory distress and is hemodynamically stable.  Chest x-ray negative for acute process.  Will treat again supportively as outlined below for bronchitis.  Discussed importance of follow-up with PCP.  Return precautions discussed, pt verbalized understanding and is agreeable to plan. Final Clinical Impressions(s) / UC Diagnoses   Final diagnoses:  Acute bronchitis, unspecified organism     Discharge Instructions     syrup for cough. Start flonase, atrovent nasal spray for nasal congestion/drainage. You can use over the counter nasal saline rinse such as neti pot for nasal congestion. Keep hydrated, your urine should be clear to pale yellow in color. Tylenol/motrin for fever and pain. Monitor for any worsening of symptoms, chest pain, shortness of breath, wheezing, swelling of the throat, go to the emergency department for further evaluation needed.     ED Prescriptions    Medication Sig Dispense Auth. Provider   albuterol (VENTOLIN HFA) 108 (90 Base) MCG/ACT inhaler Inhale 2 puffs into the lungs every 4 (four) hours as needed for wheezing or shortness of breath. 18 g Hall-Potvin, [place], PA-C   predniSONE (DELTASONE) 20 MG tablet Take 1 tablet (20 mg total) by mouth daily. 5 tablet Hall-Potvin, [place], PA-C   cetirizine (ZYRTEC ALLERGY) 10 MG tablet Take 1 tablet (10 mg total) by mouth daily. 30 tablet Hall-Potvin, [place], PA-C   guaiFENesin-dextromethorphan (ROBITUSSIN DM) 100-10 MG/5ML syrup Take 5 mLs by mouth every 4 (four) hours as needed for cough. 118 mL Hall-Potvin, [place], PA-C     PDMP not reviewed this encounter.   Hall-Potvin, [place], [place] 07/18/20 1337

## 2020-07-18 NOTE — Discharge Instructions (Addendum)
syrup for cough. Start flonase, atrovent nasal spray for nasal congestion/drainage. You can use over the counter nasal saline rinse such as neti pot for nasal congestion. Keep hydrated, your urine should be clear to pale yellow in color. Tylenol/motrin for fever and pain. Monitor for any worsening of symptoms, chest pain, shortness of breath, wheezing, swelling of the throat, go to the emergency department for further evaluation needed.

## 2020-07-18 NOTE — ED Triage Notes (Signed)
Pt here for cough and congestion x 2 weeks with hx of same in past; pt declines covid testing

## 2020-12-07 ENCOUNTER — Telehealth: Payer: Self-pay | Admitting: Orthopaedic Surgery

## 2020-12-07 NOTE — Telephone Encounter (Signed)
Receive vm from patient. She is looking for old records from [facility]. IC, lmvm [phone], to [facility]

## 2021-03-28 ENCOUNTER — Telehealth: Payer: Self-pay | Admitting: Orthopaedic Surgery

## 2021-03-28 NOTE — Telephone Encounter (Signed)
Received call from pt. Needing copy of 10/03/10 [facility] OP note on Shoulder. I adivised we can have that ready for her. Advised need to sign authorization form when comes to pickup. [phone]

## 2021-07-03 ENCOUNTER — Encounter (HOSPITAL_BASED_OUTPATIENT_CLINIC_OR_DEPARTMENT_OTHER): Payer: Self-pay

## 2021-07-03 ENCOUNTER — Other Ambulatory Visit: Payer: Self-pay

## 2021-07-03 DIAGNOSIS — E119 Type 2 diabetes mellitus without complications: Secondary | ICD-10-CM | POA: Insufficient documentation

## 2021-07-03 DIAGNOSIS — F17210 Nicotine dependence, cigarettes, uncomplicated: Secondary | ICD-10-CM | POA: Insufficient documentation

## 2021-07-03 DIAGNOSIS — Z91010 Allergy to peanuts: Secondary | ICD-10-CM | POA: Diagnosis not present

## 2021-07-03 DIAGNOSIS — R112 Nausea with vomiting, unspecified: Secondary | ICD-10-CM | POA: Insufficient documentation

## 2021-07-03 NOTE — ED Triage Notes (Signed)
Pt is present for N/V x two days. Pt states she has thrown up "like more than 20 times" and has not been able to hold even water down. Denies diarrhea or fever. No one else in the home is sick. Unsure if she ate something bad.

## 2021-07-04 ENCOUNTER — Emergency Department (HOSPITAL_BASED_OUTPATIENT_CLINIC_OR_DEPARTMENT_OTHER)
Admission: EM | Admit: 2021-07-04 | Discharge: 2021-07-04 | Disposition: A | Discharge status: Home / Self Care | Payer: Medicaid Other | Attending: Emergency Medicine | Admitting: Emergency Medicine

## 2021-07-04 ENCOUNTER — Encounter (HOSPITAL_BASED_OUTPATIENT_CLINIC_OR_DEPARTMENT_OTHER): Payer: Self-pay

## 2021-07-04 DIAGNOSIS — R112 Nausea with vomiting, unspecified: Secondary | ICD-10-CM

## 2021-07-04 LAB — CBC WITH DIFFERENTIAL/PLATELET
Abs Immature Granulocytes: 0.03 10*3/uL (ref 0.00–0.07)
Basophils Absolute: 0 10*3/uL (ref 0.0–0.1)
Basophils Relative: 0 %
Eosinophils Absolute: 0.1 10*3/uL (ref 0.0–0.5)
Eosinophils Relative: 1 %
HCT: 38.7 % (ref 36.0–46.0)
Hemoglobin: 13.2 g/dL (ref 12.0–15.0)
Immature Granulocytes: 0 %
Lymphocytes Relative: 40 %
Lymphs Abs: 4.7 10*3/uL — ABNORMAL HIGH (ref 0.7–4.0)
MCH: 32.2 pg (ref 26.0–34.0)
MCHC: 34.1 g/dL (ref 30.0–36.0)
MCV: 94.4 fL (ref 80.0–100.0)
Monocytes Absolute: 0.8 10*3/uL (ref 0.1–1.0)
Monocytes Relative: 7 %
Neutro Abs: 6.1 10*3/uL (ref 1.7–7.7)
Neutrophils Relative %: 52 %
Platelets: 269 10*3/uL (ref 150–400)
RBC: 4.1 MIL/uL (ref 3.87–5.11)
RDW: 13.5 % (ref 11.5–15.5)
WBC: 11.8 10*3/uL — ABNORMAL HIGH (ref 4.0–10.5)
nRBC: 0 % (ref 0.0–0.2)

## 2021-07-04 LAB — COMPREHENSIVE METABOLIC PANEL
ALT: 10 U/L (ref 0–44)
AST: 13 U/L — ABNORMAL LOW (ref 15–41)
Albumin: 4.2 g/dL (ref 3.5–5.0)
Alkaline Phosphatase: 46 U/L (ref 38–126)
Anion gap: 6 (ref 5–15)
BUN: 11 mg/dL (ref 6–20)
CO2: 29 mmol/L (ref 22–32)
Calcium: 9.1 mg/dL (ref 8.9–10.3)
Chloride: 104 mmol/L (ref 98–111)
Creatinine, Ser: 0.85 mg/dL (ref 0.44–1.00)
GFR, Estimated: >60 mL/min (ref >60)
Glucose, Bld: 87 mg/dL (ref 70–99)
Potassium: 3.6 mmol/L (ref 3.5–5.1)
Sodium: 139 mmol/L (ref 135–145)
Total Bilirubin: 0.4 mg/dL (ref 0.3–1.2)
Total Protein: 7 g/dL (ref 6.5–8.1)

## 2021-07-04 LAB — URINALYSIS, ROUTINE W REFLEX MICROSCOPIC
Bilirubin Urine: NEGATIVE
Glucose, UA: NEGATIVE mg/dL
Hgb urine dipstick: NEGATIVE
Ketones, ur: NEGATIVE mg/dL
Nitrite: NEGATIVE
Protein, ur: NEGATIVE mg/dL
Specific Gravity, Urine: 1.022 (ref 1.005–1.030)
pH: 6 (ref 5.0–8.0)

## 2021-07-04 LAB — PREGNANCY, URINE: Preg Test, Ur: NEGATIVE

## 2021-07-04 MED ORDER — SODIUM CHLORIDE 0.9 % IV SOLN
12.5000 mg | Freq: Four times a day (QID) | INTRAVENOUS | Status: DC | PRN
  Administered 2021-07-04: 12.5 mg via INTRAVENOUS
  Filled 2021-07-04: qty 0.5

## 2021-07-04 MED ORDER — PROMETHAZINE HCL 25 MG/ML IJ SOLN
INTRAMUSCULAR | Status: AC
  Filled 2021-07-04: qty 1

## 2021-07-04 MED ORDER — SODIUM CHLORIDE 0.9 % IV BOLUS
1000.0000 mL | Freq: Once | INTRAVENOUS | Status: AC
  Administered 2021-07-04: 1000 mL via INTRAVENOUS

## 2021-07-04 MED ORDER — PROMETHAZINE HCL 25 MG PO TABS: 25.0000 mg | ORAL_TABLET | Freq: Four times a day (QID) | ORAL | 0 refills | Status: DC | PRN

## 2021-07-04 NOTE — ED Notes (Signed)
Pt given ginger ale to drink and encouraged to drink 

## 2021-07-04 NOTE — ED Notes (Signed)
Pt encouraged to get urine specimen and states she is unable to urinate at this time; pt given warm blanket and encouraged to call when able to urinate

## 2021-07-04 NOTE — ED Provider Notes (Signed)
[facility] EMERGENCY DEPT Provider Note   CSN: [id number] Arrival date & time: 07/03/21  2349     History Chief Complaint  Patient presents with   Emesis   Nausea    SOLAE Kayla Diaz is a 35 y.o. female.  HPI     This is a 35 year old female who presents with nausea and vomiting.  Patient reports her last 2 days she has had multiple episodes of nonbilious, nonbloody emesis.  She denies abdominal pain.  No change in her bowel movements.  No fevers or known sick contacts.  She has not taken anything at home for her symptoms.  She does continue to smoke marijuana frequently but states she has smoked for 15 years without issue.  She does not believe herself to be pregnant.  No urinary symptoms.  Past Medical History:  Diagnosis Date   Diabetes (HCC)     There are no problems to display for this patient.   Past Surgical History:  Procedure Laterality Date   APPENDECTOMY     SHOULDER SURGERY       OB History     Gravida  5   Para  3   Term  3   Preterm  0   AB  1   Living  3      SAB  1   IAB  0   Ectopic  0   Multiple  0   Live Births              Family History  Problem Relation Age of Onset   Cancer Other     Social History   Tobacco Use   Smoking status: Every Day    Packs/day: 1.00    Types: Cigarettes   Smokeless tobacco: Never  Substance Use Topics   Alcohol use: No   Drug use: Yes    Types: Marijuana    Home Medications Prior to Admission medications   Medication Sig Start Date End Date Taking? Authorizing Provider  promethazine (PHENERGAN) 25 MG tablet Take 1 tablet (25 mg total) by mouth every 6 (six) hours as needed for nausea or vomiting. 07/04/21  Yes Teghan Philbin, Mayer Masker, MD  albuterol (VENTOLIN HFA) 108 (90 Base) MCG/ACT inhaler Inhale 2 puffs into the lungs every 4 (four) hours as needed for wheezing or shortness of breath. 07/18/20   Hall-Potvin, [place], PA-C  cetirizine (ZYRTEC ALLERGY) 10 MG  tablet Take 1 tablet (10 mg total) by mouth daily. 07/18/20   Hall-Potvin, [place], PA-C  guaiFENesin-dextromethorphan (ROBITUSSIN DM) 100-10 MG/5ML syrup Take 5 mLs by mouth every 4 (four) hours as needed for cough. 07/18/20   Hall-Potvin, [place], PA-C  predniSONE (DELTASONE) 20 MG tablet Take 1 tablet (20 mg total) by mouth daily. 07/18/20   Hall-Potvin, [place], PA-C    Allergies    Peanut-containing drug products, Ibuprofen [ibuprofen], Zofran, Chocolate, and Prednisone  Review of Systems   Review of Systems  Constitutional:  Negative for fever.  Respiratory:  Negative for shortness of breath.   Cardiovascular:  Negative for chest pain.  Gastrointestinal:  Positive for nausea and vomiting. Negative for abdominal pain, constipation and diarrhea.  Genitourinary:  Negative for dysuria.  All other systems reviewed and are negative.  Physical Exam Updated Vital Signs BP 107/71   Pulse 78   Temp 98.4 F (36.9 C) (Oral)   Resp 17   Ht 1.626 m (5\' 4" )   Wt 59 kg   SpO2 98%   BMI 22.31 kg/m  Physical Exam Vitals and nursing note reviewed.  Constitutional:      Appearance: She is well-developed. She is not ill-appearing.  HENT:     Head: Normocephalic and atraumatic.     Mouth/Throat:     Mouth: Mucous membranes are dry.  Eyes:     Pupils: Pupils are equal, round, and reactive to light.  Cardiovascular:     Rate and Rhythm: Normal rate and regular rhythm.     Heart sounds: Normal heart sounds.  Pulmonary:     Effort: Pulmonary effort is normal. No respiratory distress.     Breath sounds: No wheezing.  Abdominal:     General: Bowel sounds are normal.     Palpations: Abdomen is soft.     Tenderness: There is no abdominal tenderness. There is no guarding or rebound.  Musculoskeletal:     Cervical back: Neck supple.  Skin:    General: Skin is warm and dry.  Neurological:     Mental Status: She is alert and oriented to person, place, and time.  Psychiatric:         Mood and Affect: Mood normal.    ED Results / Procedures / Treatments   Labs (all labs ordered are listed, but only abnormal results are displayed) Labs Reviewed  URINALYSIS, ROUTINE W REFLEX MICROSCOPIC - Abnormal; Notable for the following components:      Result Value   Leukocytes,Ua SMALL (*)    All other components within normal limits  CBC WITH DIFFERENTIAL/PLATELET - Abnormal; Notable for the following components:   WBC 11.8 (*)    Lymphs Abs 4.7 (*)    All other components within normal limits  COMPREHENSIVE METABOLIC PANEL - Abnormal; Notable for the following components:   AST 13 (*)    All other components within normal limits  PREGNANCY, URINE    EKG None  Radiology No results found.  Procedures Procedures   Medications Ordered in ED Medications  promethazine (PHENERGAN) 12.5 mg in sodium chloride 0.9 % 50 mL IVPB (0 mg Intravenous Stopped 07/04/21 0126)  promethazine (PHENERGAN) 25 MG/ML injection (  Canceled Entry 07/04/21 0049)  sodium chloride 0.9 % bolus 1,000 mL (0 mLs Intravenous Stopped 07/04/21 0207)    ED Course  I have reviewed the triage vital signs and the nursing notes.  Pertinent labs & imaging results that were available during my care of the patient were reviewed by me and considered in my medical decision making (see chart for details).    MDM Rules/Calculators/A&P                           Patient presents with 2 days of nausea vomiting.  She is nontoxic and vital signs are reassuring.  She does clinically appear somewhat dry but otherwise is nontoxic.  She has no abdominal pain or tenderness on exam.  This would make intra-abdominal pathology less likely.  Doubt obstruction.  Question whether this may be related to a viral etiology or ongoing marijuana use.  Labs obtained and largely reassuring.  No significant metabolic derangements.  No evidence of UTI.  Do not feel patient needs imaging at this time.  She was able to tolerate fluids  after receiving Phenergan.  We discussed cessation of marijuana use as well as use of Phenergan for ongoing symptoms.  After history, exam, and medical workup I feel the patient has been appropriately medically screened and is safe for discharge home. Pertinent diagnoses were discussed  with the patient. Patient was given return precautions.  Final Clinical Impression(s) / ED Diagnoses Final diagnoses:  Nausea and vomiting, unspecified vomiting type    Rx / DC Orders ED Discharge Orders          Ordered    promethazine (PHENERGAN) 25 MG tablet  Every 6 hours PRN        07/04/21 0246             Shon Baton, MD 07/04/21 [id number]

## 2021-07-04 NOTE — ED Notes (Signed)
Pt states she is not able to urinate, pt in and out cathed without difficulty

## 2021-07-04 NOTE — Discharge Instructions (Signed)
You were seen today for nausea and vomiting.  Your work-up is reassuring.  Take Phenergan as needed.  Consider discontinuing marijuana use as sometimes this can contribute to excessive nausea and vomiting.

## 2021-07-05 ENCOUNTER — Emergency Department (HOSPITAL_BASED_OUTPATIENT_CLINIC_OR_DEPARTMENT_OTHER)
Admission: EM | Admit: 2021-07-05 | Discharge: 2021-07-05 | Discharge status: Against Medical Advice | Payer: Medicaid Other | Attending: Emergency Medicine | Admitting: Emergency Medicine

## 2021-07-05 ENCOUNTER — Encounter (HOSPITAL_BASED_OUTPATIENT_CLINIC_OR_DEPARTMENT_OTHER): Payer: Self-pay

## 2021-07-05 ENCOUNTER — Emergency Department (HOSPITAL_BASED_OUTPATIENT_CLINIC_OR_DEPARTMENT_OTHER)
Admission: EM | Admit: 2021-07-05 | Discharge: 2021-07-05 | Disposition: A | Discharge status: Home / Self Care | Payer: Medicaid Other | Attending: Emergency Medicine | Admitting: Emergency Medicine

## 2021-07-05 DIAGNOSIS — Z5321 Procedure and treatment not carried out due to patient leaving prior to being seen by health care provider: Secondary | ICD-10-CM | POA: Insufficient documentation

## 2021-07-05 DIAGNOSIS — R112 Nausea with vomiting, unspecified: Secondary | ICD-10-CM | POA: Insufficient documentation

## 2021-07-05 LAB — HCG, QUANTITATIVE, PREGNANCY: hCG, Beta Chain, Quant, S: <1 m[IU]/mL (ref <5)

## 2021-07-05 NOTE — ED Triage Notes (Signed)
Patient reports having a positive pregnancy test this morning at home. Patient would like blood test to confirm.

## 2021-07-06 ENCOUNTER — Telehealth: Payer: Self-pay

## 2021-07-06 NOTE — Telephone Encounter (Signed)
Transition Care Management Unsuccessful Follow-up Telephone Call  Date of discharge and from where:  07/05/2021-[facility]  Attempts:  1st Attempt  Reason for unsuccessful TCM follow-up call:  Left voice message

## 2021-07-07 NOTE — Telephone Encounter (Signed)
Transition Care Management Follow-up Telephone Call Date of discharge and from where: 07/04/2021 from [facility] How have you been since you were released from the hospital? Pt stated that she is feeling some better and has started the antiemetic.  Any questions or concerns? No  Items Reviewed: Did the pt receive and understand the discharge instructions provided? Yes  Medications obtained and verified? Yes  Other? No  Any new allergies since your discharge? No  Dietary orders reviewed? No Do you have support at home? Yes   Functional Questionnaire: (I = Independent and D = Dependent) ADLs: I  Bathing/Dressing- I  Meal Prep- I  Eating- I  Maintaining continence- I  Transferring/Ambulation- I  Managing Meds- I   Follow up appointments reviewed:  PCP Hospital f/u appt confirmed? No  Pt stated that she is not interested in est with a PCP at this time.  Specialist Hospital f/u appt confirmed? No   Are transportation arrangements needed? No  If their condition worsens, is the pt aware to call PCP or go to the Emergency Dept.? Yes Was the patient provided with contact information for the PCP's office or ED? Yes Was to pt encouraged to call back with questions or concerns? Yes

## 2021-07-18 DIAGNOSIS — Z30012 Encounter for prescription of emergency contraception: Secondary | ICD-10-CM | POA: Diagnosis not present

## 2022-07-04 DIAGNOSIS — M67432 Ganglion, left wrist: Secondary | ICD-10-CM | POA: Diagnosis not present

## 2022-08-16 ENCOUNTER — Other Ambulatory Visit: Payer: Self-pay

## 2022-08-16 DIAGNOSIS — M67432 Ganglion, left wrist: Secondary | ICD-10-CM | POA: Diagnosis not present

## 2022-10-18 DIAGNOSIS — S62660B Nondisplaced fracture of distal phalanx of right index finger, initial encounter for open fracture: Secondary | ICD-10-CM | POA: Diagnosis not present

## 2022-10-18 DIAGNOSIS — W540XXA Bitten by dog, initial encounter: Secondary | ICD-10-CM | POA: Diagnosis not present

## 2022-10-18 DIAGNOSIS — Z23 Encounter for immunization: Secondary | ICD-10-CM | POA: Diagnosis not present

## 2022-10-18 DIAGNOSIS — S61230A Puncture wound without foreign body of right index finger without damage to nail, initial encounter: Secondary | ICD-10-CM | POA: Diagnosis not present

## 2022-10-18 DIAGNOSIS — S61258A Open bite of other finger without damage to nail, initial encounter: Secondary | ICD-10-CM | POA: Diagnosis not present

## 2022-10-26 DIAGNOSIS — S62630D Displaced fracture of distal phalanx of right index finger, subsequent encounter for fracture with routine healing: Secondary | ICD-10-CM | POA: Diagnosis not present

## 2022-11-02 DIAGNOSIS — S62660B Nondisplaced fracture of distal phalanx of right index finger, initial encounter for open fracture: Secondary | ICD-10-CM | POA: Diagnosis not present

## 2022-11-02 DIAGNOSIS — W540XXA Bitten by dog, initial encounter: Secondary | ICD-10-CM | POA: Diagnosis not present

## 2022-12-08 DIAGNOSIS — M79644 Pain in right finger(s): Secondary | ICD-10-CM | POA: Diagnosis not present

## 2022-12-08 DIAGNOSIS — W540XXA Bitten by dog, initial encounter: Secondary | ICD-10-CM | POA: Diagnosis not present

## 2022-12-26 ENCOUNTER — Other Ambulatory Visit: Payer: Self-pay | Admitting: Physician Assistant

## 2022-12-26 DIAGNOSIS — R070 Pain in throat: Secondary | ICD-10-CM

## 2022-12-26 DIAGNOSIS — H938X1 Other specified disorders of right ear: Secondary | ICD-10-CM | POA: Diagnosis not present

## 2022-12-30 DIAGNOSIS — R1084 Generalized abdominal pain: Secondary | ICD-10-CM | POA: Diagnosis not present

## 2022-12-30 DIAGNOSIS — R112 Nausea with vomiting, unspecified: Secondary | ICD-10-CM | POA: Diagnosis not present

## 2022-12-30 DIAGNOSIS — Z885 Allergy status to narcotic agent status: Secondary | ICD-10-CM | POA: Diagnosis not present

## 2022-12-30 DIAGNOSIS — Z91018 Allergy to other foods: Secondary | ICD-10-CM | POA: Diagnosis not present

## 2022-12-30 DIAGNOSIS — Z791 Long term (current) use of non-steroidal anti-inflammatories (NSAID): Secondary | ICD-10-CM | POA: Diagnosis not present

## 2022-12-30 DIAGNOSIS — K760 Fatty (change of) liver, not elsewhere classified: Secondary | ICD-10-CM | POA: Diagnosis not present

## 2023-01-26 ENCOUNTER — Ambulatory Visit
Admission: RE | Admit: 2023-01-26 | Discharge: 2023-01-26 | Disposition: A | Discharge status: Home / Self Care | Payer: Medicaid Other | Source: Ambulatory Visit | Attending: Physician Assistant | Admitting: Physician Assistant

## 2023-01-26 DIAGNOSIS — H93293 Other abnormal auditory perceptions, bilateral: Secondary | ICD-10-CM | POA: Diagnosis not present

## 2023-01-26 DIAGNOSIS — R070 Pain in throat: Secondary | ICD-10-CM

## 2023-01-26 DIAGNOSIS — R221 Localized swelling, mass and lump, neck: Secondary | ICD-10-CM | POA: Diagnosis not present

## 2023-01-26 MED ORDER — IOPAMIDOL (ISOVUE-300) INJECTION 61%
75.0000 mL | Freq: Once | INTRAVENOUS | Status: AC | PRN
  Administered 2023-01-26: 75 mL via INTRAVENOUS

## 2023-02-09 NOTE — Telephone Encounter (Addendum)
 Patient notified of CT results, patient scheduled with Dr. Luciano for second opinion.----- Message from Velia Pry, PA-C sent at 02/08/2023  8:16 AM EDT ----- Kayla Diaz, please contact patient. Her CT of neck was normal. How are her throat symptoms doing? Her hearing testing and middle ear pressure testing were also normal. I am not sure what else I can offer her. She may elect to see one of the doctors if having persistent throat/ear symptoms. Thank you.  Velia

## 2024-05-19 ENCOUNTER — Encounter (HOSPITAL_BASED_OUTPATIENT_CLINIC_OR_DEPARTMENT_OTHER): Payer: Self-pay | Admitting: Emergency Medicine

## 2024-05-19 ENCOUNTER — Emergency Department (HOSPITAL_BASED_OUTPATIENT_CLINIC_OR_DEPARTMENT_OTHER)
Admission: EM | Admit: 2024-05-19 | Discharge: 2024-05-20 | Disposition: A | Discharge status: Home / Self Care | Attending: Emergency Medicine | Admitting: Emergency Medicine

## 2024-05-19 DIAGNOSIS — R059 Cough, unspecified: Secondary | ICD-10-CM | POA: Diagnosis not present

## 2024-05-19 DIAGNOSIS — N10 Acute tubulo-interstitial nephritis: Secondary | ICD-10-CM | POA: Diagnosis present

## 2024-05-19 DIAGNOSIS — R109 Unspecified abdominal pain: Secondary | ICD-10-CM | POA: Diagnosis not present

## 2024-05-19 DIAGNOSIS — Z91010 Allergy to peanuts: Secondary | ICD-10-CM | POA: Insufficient documentation

## 2024-05-19 DIAGNOSIS — R112 Nausea with vomiting, unspecified: Secondary | ICD-10-CM

## 2024-05-19 DIAGNOSIS — R7401 Elevation of levels of liver transaminase levels: Secondary | ICD-10-CM | POA: Insufficient documentation

## 2024-05-19 DIAGNOSIS — R7989 Other specified abnormal findings of blood chemistry: Secondary | ICD-10-CM | POA: Insufficient documentation

## 2024-05-19 DIAGNOSIS — R000 Tachycardia, unspecified: Secondary | ICD-10-CM | POA: Diagnosis not present

## 2024-05-19 DIAGNOSIS — N12 Tubulo-interstitial nephritis, not specified as acute or chronic: Secondary | ICD-10-CM | POA: Diagnosis not present

## 2024-05-19 DIAGNOSIS — Z9049 Acquired absence of other specified parts of digestive tract: Secondary | ICD-10-CM | POA: Diagnosis not present

## 2024-05-19 DIAGNOSIS — R509 Fever, unspecified: Secondary | ICD-10-CM | POA: Diagnosis not present

## 2024-05-19 LAB — COMPREHENSIVE METABOLIC PANEL WITH GFR
ALT: 98 U/L — ABNORMAL HIGH (ref 0–44)
AST: 99 U/L — ABNORMAL HIGH (ref 15–41)
Albumin: 4.2 g/dL (ref 3.5–5.0)
Alkaline Phosphatase: 144 U/L — ABNORMAL HIGH (ref 38–126)
Anion gap: 13 (ref 5–15)
BUN: 8 mg/dL (ref 6–20)
CO2: 27 mmol/L (ref 22–32)
Calcium: 9.2 mg/dL (ref 8.9–10.3)
Chloride: 98 mmol/L (ref 98–111)
Creatinine, Ser: 0.7 mg/dL (ref 0.44–1.00)
GFR, Estimated: >60 mL/min (ref >60)
Glucose, Bld: 98 mg/dL (ref 70–99)
Potassium: 3.3 mmol/L — ABNORMAL LOW (ref 3.5–5.1)
Sodium: 138 mmol/L (ref 135–145)
Total Bilirubin: 0.3 mg/dL (ref 0.0–1.2)
Total Protein: 7.3 g/dL (ref 6.5–8.1)

## 2024-05-19 LAB — CBC
HCT: 38.4 % (ref 36.0–46.0)
Hemoglobin: 13.3 g/dL (ref 12.0–15.0)
MCH: 31.7 pg (ref 26.0–34.0)
MCHC: 34.6 g/dL (ref 30.0–36.0)
MCV: 91.6 fL (ref 80.0–100.0)
Platelets: 310 K/uL (ref 150–400)
RBC: 4.19 MIL/uL (ref 3.87–5.11)
RDW: 13.2 % (ref 11.5–15.5)
WBC: 11.9 K/uL — ABNORMAL HIGH (ref 4.0–10.5)
nRBC: 0 % (ref 0.0–0.2)

## 2024-05-19 LAB — LIPASE, BLOOD: Lipase: 14 U/L (ref 11–51)

## 2024-05-19 MED ORDER — PROMETHAZINE HCL 25 MG/ML IJ SOLN
INTRAMUSCULAR | Status: AC
  Filled 2024-05-19: qty 1

## 2024-05-19 MED ORDER — SODIUM CHLORIDE 0.9 % IV BOLUS
1000.0000 mL | Freq: Once | INTRAVENOUS | Status: AC
  Administered 2024-05-19: 1000 mL via INTRAVENOUS

## 2024-05-19 MED ORDER — SODIUM CHLORIDE 0.9 % IV SOLN
12.5000 mg | Freq: Once | INTRAVENOUS | Status: AC
  Administered 2024-05-19: 12.5 mg via INTRAVENOUS
  Filled 2024-05-19: qty 0.5

## 2024-05-19 NOTE — ED Triage Notes (Signed)
 Pt states had a UTI but delayed getting meds. Got filled Saturday. Has been sick since with nausea and vomiting.

## 2024-05-19 NOTE — ED Provider Notes (Signed)
 [place] [facility] Provider Note   CSN: [id number] Arrival date & time: 05/19/24  2226     Patient presents with: Emesis   Kayla Diaz is a 38 y.o. female.  {Add pertinent medical, surgical, social history, OB history to YEP:67052} Patient reports history of diabetes but takes no medication and does not check her blood sugar.  She is here with bodyaches all over and nausea and vomiting.  Symptoms started today and she estimates she has vomited approximately 25 times.  Emesis is nonbloody.  No diarrhea.  Did have a fever 2 days ago but just chills since.  Reports fever up to 1022 days ago.  Was seen at urgent care and diagnosed with UTI on Friday but did not pick up her antibiotics until Saturday evening.  She has had 3 doses of Keflex .  Still having pain with urination and burning and frequency.  Denies any vaginal bleeding or discharge.  Complains of pain all over including her stomach and bilateral flanks.  Denies chest pain or shortness of breath.  Some nonproductive cough.  No significant headache.  No runny nose or sore throat.  Unable to keep anything down at home today.  Did not pick up her antibiotics for 2 days after she received the prescription because she does not like taking medications.  Previous appendectomy.  No history of kidney stones.  The history is provided by the patient.  Emesis Associated symptoms: abdominal pain, arthralgias, fever and myalgias   Associated symptoms: no cough and no headaches        Prior to Admission medications   Medication Sig Start Date End Date Taking? Authorizing Provider  albuterol  (VENTOLIN  HFA) 108 (90 Base) MCG/ACT inhaler Inhale 2 puffs into the lungs every 4 (four) hours as needed for wheezing or shortness of breath. 07/18/20   Hall-Potvin, [place], PA-C  cetirizine  (ZYRTEC  ALLERGY) 10 MG tablet Take 1 tablet (10 mg total) by mouth daily. 07/18/20   Hall-Potvin, [place], PA-C   guaiFENesin -dextromethorphan (ROBITUSSIN DM) 100-10 MG/5ML syrup Take 5 mLs by mouth every 4 (four) hours as needed for cough. 07/18/20   Hall-Potvin, [place], PA-C  predniSONE  (DELTASONE ) 20 MG tablet Take 1 tablet (20 mg total) by mouth daily. 07/18/20   Hall-Potvin, [place], PA-C  promethazine  (PHENERGAN ) 25 MG tablet Take 1 tablet (25 mg total) by mouth every 6 (six) hours as needed for nausea or vomiting. 07/04/21   Horton, Charmaine FALCON, MD    Allergies: Peanut-containing drug products, Ibuprofen  [ibuprofen ], Zofran , Chocolate, and Prednisone     Review of Systems  Constitutional:  Positive for activity change, appetite change, fatigue and fever.  HENT:  Negative for congestion and rhinorrhea.   Respiratory:  Negative for cough, chest tightness and shortness of breath.   Gastrointestinal:  Positive for abdominal pain and vomiting.  Genitourinary:  Positive for dysuria and urgency.  Musculoskeletal:  Positive for arthralgias and myalgias.  Skin:  Negative for rash.  Neurological:  Positive for weakness. Negative for dizziness and headaches.   all other systems are negative except as noted in the HPI and PMH.    Updated Vital Signs BP 122/82   Pulse 100   Temp 98.6 F (37 C) (Oral)   Resp 20   Ht 5' (1.524 m)   Wt 59 kg   LMP 05/06/2024 (Approximate)   SpO2 100%   BMI 25.39 kg/m   Physical Exam Vitals and nursing note reviewed.  Constitutional:      General: She  is not in acute distress.    Appearance: She is well-developed.     Comments: Dry heaving  HENT:     Head: Normocephalic and atraumatic.     Mouth/Throat:     Pharynx: No oropharyngeal exudate.  Eyes:     Conjunctiva/sclera: Conjunctivae normal.     Pupils: Pupils are equal, round, and reactive to light.  Neck:     Comments: No meningismus. Cardiovascular:     Rate and Rhythm: Regular rhythm. Tachycardia present.     Heart sounds: Normal heart sounds. No murmur heard. Pulmonary:     Effort: Pulmonary  effort is normal. No respiratory distress.     Breath sounds: Normal breath sounds.  Abdominal:     Palpations: Abdomen is soft.     Tenderness: There is abdominal tenderness. There is no guarding or rebound.     Comments: Soft diffusely tender, no guarding or rebound  Musculoskeletal:        General: Tenderness present. Normal range of motion.     Cervical back: Normal range of motion and neck supple.     Comments: Bilateral CVA tender  Skin:    General: Skin is warm.  Neurological:     Mental Status: She is alert and oriented to person, place, and time.     Cranial Nerves: No cranial nerve deficit.     Motor: No abnormal muscle tone.     Coordination: Coordination normal.     Comments:  5/5 strength throughout. CN 2-12 intact.Equal grip strength.   Psychiatric:        Behavior: Behavior normal.     (all labs ordered are listed, but only abnormal results are displayed) Labs Reviewed  COMPREHENSIVE METABOLIC PANEL WITH GFR - Abnormal; Notable for the following components:      Result Value   Potassium 3.3 (*)    AST 99 (*)    ALT 98 (*)    Alkaline Phosphatase 144 (*)    All other components within normal limits  CBC - Abnormal; Notable for the following components:   WBC 11.9 (*)    All other components within normal limits  CULTURE, BLOOD (ROUTINE X 2)  CULTURE, BLOOD (ROUTINE X 2)  RESP PANEL BY RT-PCR (RSV, FLU A&B, COVID)  RVPGX2  LIPASE, BLOOD  URINALYSIS, ROUTINE W REFLEX MICROSCOPIC  PREGNANCY, URINE  LACTIC ACID, PLASMA  LACTIC ACID, PLASMA  HCG, SERUM, QUALITATIVE  DRUG SCREEN 10 W/CONF, SERUM  CBG MONITORING, ED    EKG: None  Radiology: No results found.  {Document cardiac monitor, telemetry assessment procedure when appropriate:32947} Procedures   Medications Ordered in the ED  sodium chloride  0.9 % bolus 1,000 mL (has no administration in time range)  promethazine  (PHENERGAN ) 12.5 mg in sodium chloride  0.9 % 50 mL IVPB (has no administration in  time range)      {Click here for ABCD2, HEART and other calculators REFRESH Note before signing:1}                              Medical Decision Making Amount and/or Complexity of Data Reviewed Labs: ordered. Decision-making details documented in ED Course. Radiology: ordered and independent interpretation performed. Decision-making details documented in ED Course. ECG/medicine tests: ordered and independent interpretation performed. Decision-making details documented in ED Course.   Body aches, nausea, vomiting, chills, fever, recent diagnosis of UTI with delayed start of antibiotics.  Intractable nausea and vomiting today with abdominal pain and  flank pain.  Mildly tachycardic on arrival, abdomen soft without peritoneal signs.  Will hydrate, check labs and urinalysis  Patient given pain medication, IV fluids.  Blood sugar is 98.  Lipase is normal but does have some transaminitis without significant right upper quadrant abdominal pain. White Blood cell count 11.9.  {Document critical care time when appropriate  Document review of labs and clinical decision tools ie CHADS2VASC2, etc  Document your independent review of radiology images and any outside records  Document your discussion with family members, caretakers and with consultants  Document social determinants of health affecting pt's care  Document your decision making why or why not admission, treatments were needed:32947:::1}   Final diagnoses:  None    ED Discharge Orders     None

## 2024-05-20 ENCOUNTER — Emergency Department (HOSPITAL_BASED_OUTPATIENT_CLINIC_OR_DEPARTMENT_OTHER)

## 2024-05-20 DIAGNOSIS — N10 Acute tubulo-interstitial nephritis: Secondary | ICD-10-CM

## 2024-05-20 DIAGNOSIS — R509 Fever, unspecified: Secondary | ICD-10-CM | POA: Diagnosis not present

## 2024-05-20 DIAGNOSIS — R109 Unspecified abdominal pain: Secondary | ICD-10-CM | POA: Diagnosis not present

## 2024-05-20 DIAGNOSIS — R059 Cough, unspecified: Secondary | ICD-10-CM | POA: Diagnosis not present

## 2024-05-20 HISTORY — DX: Acute pyelonephritis: N10

## 2024-05-20 LAB — ACETAMINOPHEN LEVEL: Acetaminophen (Tylenol), Serum: <10 ug/mL — ABNORMAL LOW (ref 10–30)

## 2024-05-20 LAB — RESP PANEL BY RT-PCR (RSV, FLU A&B, COVID)  RVPGX2
Influenza A by PCR: NEGATIVE
Influenza B by PCR: NEGATIVE
Resp Syncytial Virus by PCR: NEGATIVE
SARS Coronavirus 2 by RT PCR: NEGATIVE

## 2024-05-20 LAB — LACTIC ACID, PLASMA
Lactic Acid, Venous: 0.6 mmol/L (ref 0.5–1.9)
Lactic Acid, Venous: 1.6 mmol/L (ref 0.5–1.9)

## 2024-05-20 LAB — URINALYSIS, ROUTINE W REFLEX MICROSCOPIC
Bilirubin Urine: NEGATIVE
Glucose, UA: NEGATIVE mg/dL
Ketones, ur: NEGATIVE mg/dL
Leukocytes,Ua: NEGATIVE
Nitrite: NEGATIVE
Protein, ur: 30 mg/dL — AB
Specific Gravity, Urine: 1.01 (ref 1.005–1.030)
pH: 7 (ref 5.0–8.0)

## 2024-05-20 LAB — URINE DRUG SCREEN
Amphetamines: POSITIVE — AB
Barbiturates: NEGATIVE
Benzodiazepines: NEGATIVE
Cocaine: NEGATIVE
Fentanyl: NEGATIVE
Methadone Scn, Ur: NEGATIVE
Opiates: NEGATIVE
Tetrahydrocannabinol: POSITIVE — AB

## 2024-05-20 LAB — URINALYSIS, MICROSCOPIC (REFLEX)

## 2024-05-20 LAB — CBG MONITORING, ED: Glucose-Capillary: 88 mg/dL (ref 70–99)

## 2024-05-20 LAB — ETHANOL: Alcohol, Ethyl (B): <15 mg/dL (ref <15)

## 2024-05-20 LAB — HCG, SERUM, QUALITATIVE: Preg, Serum: NEGATIVE

## 2024-05-20 MED ORDER — LIDOCAINE VISCOUS HCL 2 % MT SOLN
15.0000 mL | Freq: Once | OROMUCOSAL | Status: AC
  Administered 2024-05-20: 15 mL via OROMUCOSAL
  Filled 2024-05-20: qty 15

## 2024-05-20 MED ORDER — CEPHALEXIN 500 MG PO CAPS: 500.0000 mg | ORAL_CAPSULE | Freq: Three times a day (TID) | ORAL | 0 refills | Status: DC

## 2024-05-20 MED ORDER — PROMETHAZINE HCL 25 MG PO TABS: 25.0000 mg | ORAL_TABLET | Freq: Four times a day (QID) | ORAL | 0 refills | Status: DC | PRN

## 2024-05-20 MED ORDER — KETOROLAC TROMETHAMINE 30 MG/ML IJ SOLN
15.0000 mg | Freq: Once | INTRAMUSCULAR | Status: AC
Start: 2024-05-20 — End: 2024-05-20
  Administered 2024-05-20: 15 mg via INTRAVENOUS
  Filled 2024-05-20: qty 1

## 2024-05-20 MED ORDER — CEFTRIAXONE SODIUM 1 G IJ SOLR
1.0000 g | Freq: Once | INTRAMUSCULAR | Status: AC
  Administered 2024-05-20: 1 g via INTRAVENOUS
  Filled 2024-05-20: qty 10

## 2024-05-20 MED ORDER — SODIUM CHLORIDE 0.9 % IV BOLUS
1000.0000 mL | Freq: Once | INTRAVENOUS | Status: AC
  Administered 2024-05-20: 1000 mL via INTRAVENOUS

## 2024-05-20 MED ORDER — POTASSIUM CHLORIDE CRYS ER 20 MEQ PO TBCR
30.0000 meq | EXTENDED_RELEASE_TABLET | Freq: Once | ORAL | Status: AC
Start: 2024-05-20 — End: 2024-05-20
  Administered 2024-05-20: 30 meq via ORAL

## 2024-05-20 MED ORDER — ALUM & MAG HYDROXIDE-SIMETH 200-200-20 MG/5ML PO SUSP
30.0000 mL | Freq: Once | ORAL | Status: AC
  Administered 2024-05-20: 30 mL via ORAL
  Filled 2024-05-20: qty 30

## 2024-05-20 MED ORDER — IOHEXOL 300 MG/ML  SOLN
100.0000 mL | Freq: Once | INTRAMUSCULAR | Status: AC | PRN
Start: 2024-05-20 — End: 2024-05-20
  Administered 2024-05-20: 100 mL via INTRAVENOUS

## 2024-05-20 NOTE — Discharge Instructions (Signed)
 Stop using marijuana.  Take the antibiotic Keflex  3 times daily.  He will be called if your cultures are positive of your blood in her urine or you need further antibiotics.  You declined admission to the hospital today.  Keep yourself hydrated.  Start with a clear liquid diet and advance slowly as tolerated.  Take your medications as prescribed.  Return to the ED for worsening pain, fever, vomiting or any concerns

## 2024-05-20 NOTE — ED Notes (Signed)
 Pt tolerating grape juice and ginger ale with out complications.

## 2024-05-20 NOTE — Plan of Care (Addendum)
 [facility] telemetry bed transfer:  38 year old female history of reactive airway disease presented to emergency department reported she has history of diabetes and does not take medication and check her blood glucose. Currently she is having generalized body ache with associated nausea vomiting for last 2 days.  Patient reported she went to urgent care and diagnosed with UTI on Friday but did not pick up the antibiotic until Saturday evening.  She took total 3 doses of Keflex  however continues to have UTI symptoms.  Patient is also endorsing stomach and bilateral flank pain.  No other complaint yet.  At presentation to ED patient is hemodynamically stable. UDS positive with marijuana amphetamine. Pending UA. CBC showing leukocytosis 11.9 otherwise unremarkable. MP showing low potassium 3.3, normal blood glucose level, elevated AST/ALT/ALP 99/90/144.  Normal bilirubin level. Normal lactic acid level.  Respiratory panel negative for COVID/RSV/flu.  CT abdomen pelvis right-sided pyelonephritis. Chest x-ray no acute cardiopulmonary abnormality.  In the ED patient has been given GI cocktail, 2 L of NS bolus, Toradol  and ceftriaxone  1 g.  Hospitalist has been consulted for management of acute pyelonephritis, intractable nausea/vomiting in the setting of marijuana use, transaminitis in the setting of dehydration.  Need to follow-up with UA however not sure if it is going to show anything or not given patient already on Keflex  for last 3 days.   Sayge Brienza, MD [facility] 05/20/2024, 2:58 AM

## 2024-05-20 NOTE — ED Notes (Signed)
 Pt placement and [facility] were contacted in regards to the pt is going to be DC a this time

## 2024-05-20 NOTE — ED Notes (Signed)
 ED Provider at bedside.

## 2024-05-21 LAB — URINE CULTURE: Culture: NO GROWTH

## 2024-05-23 ENCOUNTER — Telehealth (HOSPITAL_BASED_OUTPATIENT_CLINIC_OR_DEPARTMENT_OTHER): Payer: Self-pay | Admitting: Emergency Medicine

## 2024-05-23 LAB — BLOOD CULTURE ID PANEL (REFLEXED) - BCID2

## 2024-05-23 LAB — DRUG SCREEN 10 W/CONF, SERUM
Amphetamines, IA: NEGATIVE ng/mL
Barbiturates, IA: NEGATIVE ug/mL
Benzodiazepines, IA: NEGATIVE ng/mL
Cocaine & Metabolite, IA: NEGATIVE ng/mL
Methadone, IA: NEGATIVE ng/mL
Opiates, IA: NEGATIVE ng/mL
Oxycodones, IA: NEGATIVE ng/mL
Phencyclidine, IA: NEGATIVE ng/mL
Propoxyphene, IA: NEGATIVE ng/mL

## 2024-05-23 LAB — THC,MS,WB/SP RFX

## 2024-05-24 DIAGNOSIS — R100 Acute abdomen: Secondary | ICD-10-CM | POA: Diagnosis not present

## 2024-05-24 DIAGNOSIS — N12 Tubulo-interstitial nephritis, not specified as acute or chronic: Secondary | ICD-10-CM | POA: Diagnosis not present

## 2024-05-25 DIAGNOSIS — N12 Tubulo-interstitial nephritis, not specified as acute or chronic: Secondary | ICD-10-CM | POA: Diagnosis not present

## 2024-05-25 LAB — CULTURE, BLOOD (ROUTINE X 2)
Culture: NO GROWTH
Special Requests: ADEQUATE

## 2024-05-25 NOTE — Discharge Summary (Signed)
 Hospitalist  Discharge Summary   Name: Kayla Diaz Age: 38 yrs  MRN: [id number] DOB: 07-20-1986  Admit Date: 05/23/2024 Admitting Physician: Elsie Karolynn Rover, MD  Discharge Date: 05/25/2024 Discharge Physician: Elsie Karolynn Rover, *   Admission Diagnosis:   Bacteremia [R78.81] Pyelonephritis [N12] Acute pyelonephritis [N10]   Discharge Diagnoses:   Principal Problem:   Pyelonephritis Polysubstance use   TO DO List at Follow-up for PCP/Specialist:   Key Medication changes: Cipro 500 mg BID for 7 days (END: 09/26) Pending labs to follow up on: Culture from [facility] from 09/15 Incidental findings requiring follow-up: None  Other: None  Pending Labs     Order Current Status   Urine Culture In process   Blood Culture, 1st Set Preliminary result   Blood Culture, 2nd Set Preliminary result        Hospital Course:   For full details, please see H&P, progress notes, consult notes and ancillary notes. Briefly, Kayla Diaz is a 38 y.o. year old female with a PMH of asthma and type II DM, who presented with pyelonephritis. The patient's hospital course will be summarized in a problem based approach below.   #Pyelonephritis #Possible bacteremia Patient was seen at [facility] on 09/15 and diagnosed with a UTI and discharged from ED with Keflex . Was called back on 09/19 stating blood cultures from her visit 1/4 bottles positive for gram negative rods, so she presented here for further evaluation. CT A/P was concerning for pyelonephritis. Patient was initiated on IV Ceftriaxone  2g  with symptoms improving during the admission. We attempted calling [facility] [place] [facility] lab and the final culture results were not yet finalized on the gram negative rods. Cultures here remained negative. Plan is to discharge her on PO Cipro for 7 days (end date 9/26).   #Polysubstance Use Patient's UDS was positive for amphetamines, fentanyl , and  marijuana. Social work was consulted to provide relevant resources to patient.   The patient's chronic medical conditions were treated accordingly per the patient's home medication regimen except as noted in the plan above and in the medication list below.    Discharge Condition:   Disposition: Patient discharged to Home or Self Care in good condition.  Diet at discharge: Adult Diet- Regular  Activity at Discharge: Ambulate ad lib  Wound/Incision Care Instructions: None  Photo of wound/incision: None       Physical Exam at Discharge   BP 106/69 (BP Location: Left arm, Patient Position: Lying)   Pulse 79   Temp 98.3 F (36.8 C) (Oral)   Resp 17   Ht 1.626 m (5' 4)   Wt 59.4 kg (131 lb)   SpO2 100%   BMI 22.49 kg/m   General: NAD, laying in bed CV: RRR, no murmurs, rubs or gallops Lungs: Clear to auscultation bilaterally, no wheezing, rales or rhonci Abdomen: Soft, non-tender, non-distended. +bowel sounds Neuro: No focal or neurological deficits Skin: No rash or jaundice   Discharge Medications:       Medication List     START taking these medications    ciprofloxacin 500 mg tablet Commonly known as: CIPRO Take 1 tablet (500 mg total) by mouth 2 (two) times a day for 7 days.       CONTINUE taking these medications    fluticasone propionate 50 mcg/spray nasal spray Commonly known as: FLONASE Instill 2 puffs each nostril daily.       STOP taking these medications    cephALEXin  500 mg capsule  Commonly known as: KEFLEX    omeprazole 40 mg DR capsule Commonly known as: PriLOSEC   Prenatal Vitamin 27 mg iron- 800 mcg Tab Generic drug: prenatal vit no.124-iron-folic acid       ASK your doctor about these medications    promethazine  25 mg tablet Commonly known as: PHENERGAN  Take 25 mg by mouth every 6 (six) hours as needed for nausea or vomiting.         Where to Get Your Medications     These medications were sent to [id number]  - [place], [place] [place] [employer] - [address] - PHONE: [phone] - FAX: [phone]  [address] [place] [address]    Phone: [phone]  ciprofloxacin 500 mg tablet     Significant Diagnostic Tests:   LABS:  Lab Results  Component Value Date   WBC 9.10 05/25/2024   HGB 12.8 05/25/2024   HCT 37.4 05/25/2024   PLT 408 05/25/2024   ALT 44 05/23/2024   AST 16 05/23/2024   NA 136 05/25/2024   K 4.1 05/25/2024   CL 104 05/25/2024   CREATININE 0.66 05/25/2024   BUN 7 05/25/2024   CO2 27 05/25/2024   HGBA1C 5.6 05/24/2024   IMAGING:  CT Abdomen Pelvis W Contrast  Final Result by Chares Peel, MD (09/20 0231)  CT ABDOMEN PELVIS W CONTRAST, 05/24/2024 1:24 AM    INDICATION: Abdominal pain, acute, nonlocalized, recent dx of pyelo at   OSH, concern for bacteremia \   ADDITIONAL HISTORY: None.  COMPARISON: None.    TECHNIQUE: CT images of the abdomen and pelvis were obtained after   intravenous administration of iodinated contrast. Conventional axial   reconstructions and multiplanar reformatted images were submitted for   review.     FINDINGS:    . Lower Chest: No focal consolidation or pleural effusion.    . Liver: No suspicious focal findings. Focal fat deposition ligament.  . Gallbladder/Biliary: Unremarkable.  SABRA Spleen: Unremarkable.  . Pancreas: Unremarkable.  . Adrenals: Unremarkable.  . Kidneys: Heterogenous enhancement of the right mid and lower pole   (series 7, image 59 and series 3, image 58), with asymmetric adjacent   perinephric inflammatory fat stranding. No perinephric abscess. No   hydronephrosis. No radiopaque renal stone.    . Peritoneum/Mesenteries/Extraperitoneum: No free air. Nonspecific trace   pelvic free fluid. No loculated drainable collection. No pathologically   enlarged lymph nodes.  . Gastrointestinal tract: No evidence of obstruction. Prior appendectomy.    . Ureters: Unremarkable.  . Bladder: Unremarkable.  . Reproductive  System: Right corpus luteal cyst.    . Vascular: Unremarkable.  . Musculoskeletal: No acute displaced fractures. Polyarticular   degenerative changes. No aggressive focal bony lesions. Abdominal wall   soft tissues unremarkable.    IMPRESSION:  Heterogenous enhancement of the right kidney which can be seen with   pyelonephritis. No perinephric abscess.      CULTURES:  Results for orders placed or performed during the hospital encounter of 05/23/24 (from the past week)  Blood Culture, 1st Set   Specimen: Venous; Blood  Result Value Ref Range   Blood Culture No growth after 1 day.   Blood Culture, 2nd Set   Specimen: Venous; Blood  Result Value Ref Range   Blood Culture No growth after 1 day.    PATHOLOGY: None OTHER: None  Surgeries/Procedures:    Other procedures performed: None  Consults:   None  Follow-up Appointments:    No future appointments.  Electronically signed by: Elsie Karolynn Rover, MD 05/25/2024 2:49 PM Time spent on discharge: 50 minutes.

## 2024-05-26 ENCOUNTER — Telehealth (HOSPITAL_BASED_OUTPATIENT_CLINIC_OR_DEPARTMENT_OTHER): Payer: Self-pay | Admitting: *Deleted

## 2024-05-26 NOTE — Telephone Encounter (Signed)
 Post ED Visit - Positive Culture Follow-up  Culture report reviewed by antimicrobial stewardship pharmacist: [facility] [x]  Koren Or, Pharm.D. []  Venetia Gully, Pharm.D., BCPS AQ-ID []  Garrel Crews, Pharm.D., BCPS []  Almarie Lunger, Pharm.D., BCPS []  [place], [address].D., BCPS, AAHIVP []  Rosaline Bihari, Pharm.D., BCPS, AAHIVP []  Vernell Meier, PharmD, BCPS []  Latanya Hint, PharmD, BCPS []  Donald Medley, PharmD, BCPS []  Rocky Bold, PharmD []  Dorothyann Alert, PharmD, BCPS []  Morene Babe, PharmD  [facility] []  Rosaline Edison, PharmD []  Romona Bliss, PharmD []  Dolphus Roller, PharmD []  Veva Seip, Rph []  Vernell Daunt) Leonce, PharmD []  Eva Allis, PharmD []  Rosaline Millet, PharmD []  Iantha Batch, PharmD []  Arvin Gauss, PharmD []  Wanda Hasting, PharmD []  Ronal Rav, PharmD []  Rocky Slade, PharmD []  Bard Jeans, PharmD   Positive blood culture Treated with IV abx at [facility] (admitted 9/19); d/c'd home 9/21 with Cipro, organism sensitive to the same and no further patient follow-up is required at this time.  Lorita Barnie Pereyra 05/26/2024, 11:15 AM

## 2024-06-01 DIAGNOSIS — R000 Tachycardia, unspecified: Secondary | ICD-10-CM | POA: Diagnosis not present

## 2024-06-04 ENCOUNTER — Telehealth (HOSPITAL_BASED_OUTPATIENT_CLINIC_OR_DEPARTMENT_OTHER): Payer: Self-pay

## 2024-06-08 ENCOUNTER — Other Ambulatory Visit (HOSPITAL_BASED_OUTPATIENT_CLINIC_OR_DEPARTMENT_OTHER)

## 2024-06-11 ENCOUNTER — Ambulatory Visit (HOSPITAL_BASED_OUTPATIENT_CLINIC_OR_DEPARTMENT_OTHER)
Admission: RE | Admit: 2024-06-11 | Discharge: 2024-06-11 | Disposition: A | Discharge status: Home / Self Care | Source: Ambulatory Visit | Attending: Emergency Medicine | Admitting: Emergency Medicine

## 2024-06-11 ENCOUNTER — Emergency Department (HOSPITAL_BASED_OUTPATIENT_CLINIC_OR_DEPARTMENT_OTHER): Admission: EM | Admit: 2024-06-11 | Discharge: 2024-06-11 | Discharge status: Against Medical Advice

## 2024-06-11 ENCOUNTER — Other Ambulatory Visit: Payer: Self-pay

## 2024-06-11 ENCOUNTER — Encounter (HOSPITAL_BASED_OUTPATIENT_CLINIC_OR_DEPARTMENT_OTHER): Payer: Self-pay | Admitting: Emergency Medicine

## 2024-06-11 DIAGNOSIS — R7989 Other specified abnormal findings of blood chemistry: Secondary | ICD-10-CM | POA: Diagnosis present

## 2024-06-11 DIAGNOSIS — O26891 Other specified pregnancy related conditions, first trimester: Secondary | ICD-10-CM | POA: Insufficient documentation

## 2024-06-11 DIAGNOSIS — Z5329 Procedure and treatment not carried out because of patient's decision for other reasons: Secondary | ICD-10-CM | POA: Diagnosis not present

## 2024-06-11 DIAGNOSIS — R112 Nausea with vomiting, unspecified: Secondary | ICD-10-CM | POA: Insufficient documentation

## 2024-06-11 DIAGNOSIS — Z3490 Encounter for supervision of normal pregnancy, unspecified, unspecified trimester: Secondary | ICD-10-CM

## 2024-06-11 DIAGNOSIS — Z91010 Allergy to peanuts: Secondary | ICD-10-CM | POA: Insufficient documentation

## 2024-06-11 DIAGNOSIS — Z3A01 Less than 8 weeks gestation of pregnancy: Secondary | ICD-10-CM | POA: Insufficient documentation

## 2024-06-11 DIAGNOSIS — R7401 Elevation of levels of liver transaminase levels: Secondary | ICD-10-CM | POA: Diagnosis not present

## 2024-06-11 DIAGNOSIS — Z0189 Encounter for other specified special examinations: Secondary | ICD-10-CM

## 2024-06-11 LAB — COMPREHENSIVE METABOLIC PANEL WITH GFR
ALT: 109 U/L — ABNORMAL HIGH (ref 0–44)
AST: 56 U/L — ABNORMAL HIGH (ref 15–41)
Albumin: 4.7 g/dL (ref 3.5–5.0)
Alkaline Phosphatase: 65 U/L (ref 38–126)
Anion gap: 13 (ref 5–15)
BUN: 7 mg/dL (ref 6–20)
CO2: 24 mmol/L (ref 22–32)
Calcium: 9.3 mg/dL (ref 8.9–10.3)
Chloride: 102 mmol/L (ref 98–111)
Creatinine, Ser: 0.58 mg/dL (ref 0.44–1.00)
GFR, Estimated: >60 mL/min (ref >60)
Glucose, Bld: 95 mg/dL (ref 70–99)
Potassium: 4 mmol/L (ref 3.5–5.1)
Sodium: 139 mmol/L (ref 135–145)
Total Bilirubin: 0.4 mg/dL (ref 0.0–1.2)
Total Protein: 7.4 g/dL (ref 6.5–8.1)

## 2024-06-11 LAB — CBC WITH DIFFERENTIAL/PLATELET
Abs Immature Granulocytes: 0.1 K/uL — ABNORMAL HIGH (ref 0.00–0.07)
Basophils Absolute: 0.1 K/uL (ref 0.0–0.1)
Basophils Relative: 1 %
Eosinophils Absolute: 0.1 K/uL (ref 0.0–0.5)
Eosinophils Relative: 1 %
HCT: 36.4 % (ref 36.0–46.0)
Hemoglobin: 12.4 g/dL (ref 12.0–15.0)
Immature Granulocytes: 1 %
Lymphocytes Relative: 25 %
Lymphs Abs: 2.8 K/uL (ref 0.7–4.0)
MCH: 32.1 pg (ref 26.0–34.0)
MCHC: 34.1 g/dL (ref 30.0–36.0)
MCV: 94.3 fL (ref 80.0–100.0)
Monocytes Absolute: 0.7 K/uL (ref 0.1–1.0)
Monocytes Relative: 7 %
Neutro Abs: 7.2 K/uL (ref 1.7–7.7)
Neutrophils Relative %: 65 %
Platelets: 324 K/uL (ref 150–400)
RBC: 3.86 MIL/uL — ABNORMAL LOW (ref 3.87–5.11)
RDW: 14.7 % (ref 11.5–15.5)
WBC: 10.9 K/uL — ABNORMAL HIGH (ref 4.0–10.5)
nRBC: 0 % (ref 0.0–0.2)

## 2024-06-11 LAB — URINALYSIS, ROUTINE W REFLEX MICROSCOPIC
Bilirubin Urine: NEGATIVE
Glucose, UA: NEGATIVE mg/dL
Hgb urine dipstick: NEGATIVE
Ketones, ur: NEGATIVE mg/dL
Leukocytes,Ua: NEGATIVE
Nitrite: NEGATIVE
Protein, ur: NEGATIVE mg/dL
Specific Gravity, Urine: 1.02 (ref 1.005–1.030)
pH: 7 (ref 5.0–8.0)

## 2024-06-11 LAB — HCG, QUANTITATIVE, PREGNANCY: hCG, Beta Chain, Quant, S: 7348 m[IU]/mL — ABNORMAL HIGH (ref <5)

## 2024-06-11 LAB — PREGNANCY, URINE: Preg Test, Ur: POSITIVE — AB

## 2024-06-11 NOTE — ED Notes (Signed)
 Pt states she has been feeling bad for a few weeks had an US  today and was also stating she needs repeat blood work,

## 2024-06-11 NOTE — ED Triage Notes (Signed)
 States needs blood  work unsure what kind states was told to come her states had US  of abd today  GB, liver and kidney needs results

## 2024-06-11 NOTE — ED Notes (Signed)
 Pt states that sh cannot stay for extra tests because she has to go pick up her 30 y o daughter from school at [address] in [place]

## 2024-06-11 NOTE — Discharge Instructions (Addendum)
 You have some ongoing mild elevation of your liver enzymes. Please follow up with gastroenterology regarding this.  I gave you the name and number.  Please give them a call.  They can get you in to see them.  Your right upper quad ultrasound was unremarkable.   As we discussed, you did test positive for pregnancy.  I recommended you stay for an ultrasound to see if we can identify location of fetus to make sure there is no ectopic.  We discussed the risk of you leaving including unidentified ectopic which can lead to serious health consequences including death.  I highly recommend you have close follow-up with the OB/GYN since you are not staying  to have this ultrasound completed here.  Please give your OB/GYN a call to establish care soon as possible  If you have any kind of abdominal pain, fevers or other concerns then please come back to the ED immediately.

## 2024-06-11 NOTE — ED Provider Notes (Signed)
  EMERGENCY DEPARTMENT AT [facility] Provider Note   CSN: [id number] Arrival date & time: 06/11/24  1038     Patient presents with: Follow-up   Kayla Diaz is a 38 y.o. female.   HPI  Patient presents because of recheck laboratory values.  Patient states that she was told to get repeat laboratory workup and stuff such her recent infection with pyelonephritis.  Patient states that she is feeling totally back to normal.  No fever no chills.  No chest pain or shortness of breath.  No nausea vomit diarrhea.  No dysuria.  Afebrile.  Has absolutely 0 complaints.  Previous medical history reviewed : Patient was admitted and discharged on May 25, 2024 in the setting of concern for bacteremia, pyelonephritis.  Blood cultures at the hospital stay was unremarkable.  When looking back at the blood cultures obtained here at [facility], 1 bowel tested positive for acinetobacter ursingii.      Prior to Admission medications   Medication Sig Start Date End Date Taking? Authorizing Provider  albuterol  (VENTOLIN  HFA) 108 (90 Base) MCG/ACT inhaler Inhale 2 puffs into the lungs every 4 (four) hours as needed for wheezing or shortness of breath. 07/18/20   Hall-Potvin, [place], PA-C  cephALEXin  (KEFLEX ) 500 MG capsule Take 1 capsule (500 mg total) by mouth 3 (three) times daily. 05/20/24   Rancour, Garnette, MD  cetirizine  (ZYRTEC  ALLERGY) 10 MG tablet Take 1 tablet (10 mg total) by mouth daily. 07/18/20   Hall-Potvin, [place], PA-C  guaiFENesin -dextromethorphan (ROBITUSSIN DM) 100-10 MG/5ML syrup Take 5 mLs by mouth every 4 (four) hours as needed for cough. 07/18/20   Hall-Potvin, [place], PA-C  predniSONE  (DELTASONE ) 20 MG tablet Take 1 tablet (20 mg total) by mouth daily. 07/18/20   Hall-Potvin, [place], PA-C  promethazine  (PHENERGAN ) 25 MG tablet Take 1 tablet (25 mg total) by mouth every 6 (six) hours as needed for nausea or vomiting. 05/20/24   Rancour, Garnette, MD     Allergies: Peanut-containing drug products, Ibuprofen  [ibuprofen ], Zofran , Chocolate, and Prednisone     Review of Systems  Constitutional:  Negative for chills and fever.  HENT:  Negative for ear pain and sore throat.   Eyes:  Negative for pain and visual disturbance.  Respiratory:  Negative for cough and shortness of breath.   Cardiovascular:  Negative for chest pain and palpitations.  Gastrointestinal:  Negative for abdominal pain and vomiting.  Genitourinary:  Negative for dysuria and hematuria.  Musculoskeletal:  Negative for arthralgias and back pain.  Skin:  Negative for color change and rash.  Neurological:  Negative for seizures and syncope.  All other systems reviewed and are negative.   Updated Vital Signs BP 120/78 (BP Location: Left Arm)   Pulse (!) 101   Temp 98.4 F (36.9 C)   Resp 18   Ht 5' 4 (1.626 m)   Wt 59.2 kg   LMP 05/06/2024 (Approximate)   SpO2 100%   BMI 22.40 kg/m   Physical Exam Vitals and nursing note reviewed.  Constitutional:      General: She is not in acute distress.    Appearance: She is well-developed.  HENT:     Head: Normocephalic and atraumatic.  Eyes:     Conjunctiva/sclera: Conjunctivae normal.  Cardiovascular:     Rate and Rhythm: Normal rate and regular rhythm.     Heart sounds: No murmur heard. Pulmonary:     Effort: Pulmonary effort is normal. No respiratory distress.     Breath sounds:  Normal breath sounds.  Abdominal:     Palpations: Abdomen is soft.     Tenderness: There is no abdominal tenderness.  Musculoskeletal:        General: No swelling.     Cervical back: Neck supple.  Skin:    General: Skin is warm and dry.     Capillary Refill: Capillary refill takes less than 2 seconds.  Neurological:     Mental Status: She is alert.  Psychiatric:        Mood and Affect: Mood normal.     (all labs ordered are listed, but only abnormal results are displayed) Labs Reviewed  CBC WITH DIFFERENTIAL/PLATELET -  Abnormal; Notable for the following components:      Result Value   WBC 10.9 (*)    RBC 3.86 (*)    Abs Immature Granulocytes 0.10 (*)    All other components within normal limits  COMPREHENSIVE METABOLIC PANEL WITH GFR - Abnormal; Notable for the following components:   AST 56 (*)    ALT 109 (*)    All other components within normal limits  PREGNANCY, URINE - Abnormal; Notable for the following components:   Preg Test, Ur POSITIVE (*)    All other components within normal limits  HCG, QUANTITATIVE, PREGNANCY - Abnormal; Notable for the following components:   hCG, Beta Chain, Quant, S 7,348 (*)    All other components within normal limits  URINALYSIS, ROUTINE W REFLEX MICROSCOPIC    EKG: None  Radiology: US  ABDOMEN LIMITED RUQ (LIVER/GB) Result Date: 06/11/2024 EXAM: Right Upper Quadrant Abdominal Ultrasound TECHNIQUE: Real-time ultrasonography of the right upper quadrant of the abdomen was performed. COMPARISON: None. CLINICAL HISTORY: Elevated LFTs. FINDINGS: LIVER: The liver demonstrates normal echogenicity. No intrahepatic biliary ductal dilatation. No mass. BILIARY SYSTEM: No evidence of pericholecystic fluid or wall thickening. No cholelithiasis. Negative sonographic Murphy's sign. Common bile duct is within normal limits measuring 3 mm. OTHER: No right upper quadrant ascites. IMPRESSION: 1. No acute abnormalities. Electronically signed by: Ryan Salvage MD 06/11/2024 11:09 AM EDT RP Workstation: [id number]     Procedures   Medications Ordered in the ED - No data to display                                  Medical Decision Making Amount and/or Complexity of Data Reviewed Labs: ordered.     HPI:  Patient presents because of recheck laboratory values.  Patient states that she was told to get repeat laboratory workup and stuff such her recent infection with pyelonephritis.  Patient states that she is feeling totally back to normal.  No fever no chills.  No chest  pain or shortness of breath.  No nausea vomit diarrhea.  No dysuria.  Afebrile.  Has absolutely 0 complaints.  Previous medical history reviewed : Patient was admitted and discharged on May 25, 2024 in the setting of concern for bacteremia, pyelonephritis.  Blood cultures at the hospital stay was unremarkable.  When looking back at the blood cultures obtained here at [facility], 1 bowel tested positive for acinetobacter ursingii.   MDM:   Upon exam, patient hemodynamically stable.  Initial heart rate was 101 when she first walked into the ED but upon my examination, heart rate in the 80s.   Denies all complaints.  As above, recent admission back in September because of pyelonephritis.  Patient denies any symptoms consistent for any kind of pyelonephritis at  this point in time.   Reviewed imaging that was obtained today.  Patient had imaging of right upper quad ultrasound which was unremarkable for acute pathology.   I did recheck labs as well as urine pregnancy.  In terms of patient's labs.  Mild elevation in AST and ALT.  Reviewed patient's imaging.  Unremarkable.  No pain in epigastric area.  This can be followed outpatient given repeated slight elevation.  She has absolute no symptoms regarding right upper quadrant or epigastric pain.   Patient's urine did come back positive for pregnancy.  Added on a serum hCG quant.  This is 7000.  Patient does endorse unprotected sex.  No vaginal discharge.  No concerns for STD.  No vaginal bleeding.  Did discuss patient staying here for transvaginal ultrasound to see if we can identify location of said fetus.  Likely very early on given menstrual cycles last month.  Patient elected to leave.  Explained the risk including worsening health outcomes and/or death.  Patient states that she understands and still has to leave.  Instructed to have strict return precautions as well as quick follow-up with OB/GYN given advanced age as well as unidentified location  of fetus    I have independently interpreted the u/s images and agree with the radiologist finding   Social Determinant of Health: history of substance abuse    Disposition and Follow Up: OB/GYN       Final diagnoses:  Pregnancy, unspecified gestational age  LFT elevation  Routine lab draw    ED Discharge Orders     None          Simon Lavonia SAILOR, MD 06/11/24 1347

## 2024-06-13 ENCOUNTER — Telehealth (HOSPITAL_BASED_OUTPATIENT_CLINIC_OR_DEPARTMENT_OTHER): Payer: Self-pay

## 2024-07-01 ENCOUNTER — Telehealth (INDEPENDENT_AMBULATORY_CARE_PROVIDER_SITE_OTHER)

## 2024-07-01 DIAGNOSIS — F1290 Cannabis use, unspecified, uncomplicated: Secondary | ICD-10-CM | POA: Diagnosis not present

## 2024-07-01 DIAGNOSIS — Z3491 Encounter for supervision of normal pregnancy, unspecified, first trimester: Secondary | ICD-10-CM | POA: Diagnosis not present

## 2024-07-01 DIAGNOSIS — Z3490 Encounter for supervision of normal pregnancy, unspecified, unspecified trimester: Secondary | ICD-10-CM | POA: Insufficient documentation

## 2024-07-01 DIAGNOSIS — Z8279 Family history of other congenital malformations, deformations and chromosomal abnormalities: Secondary | ICD-10-CM | POA: Insufficient documentation

## 2024-07-01 MED ORDER — PRENATAL PLUS VITAMIN/MINERAL 27-1 MG PO TABS
1.0000 | ORAL_TABLET | Freq: Every day | ORAL | 11 refills | Status: AC
Start: 2024-07-01 — End: ?

## 2024-07-01 MED ORDER — BLOOD PRESSURE KIT DEVI: 1.0000 | Freq: Once | 0 refills | Status: AC

## 2024-07-01 NOTE — Patient Instructions (Signed)

## 2024-07-01 NOTE — Progress Notes (Signed)
 New OB Intake  I connected with Kayla Diaz  on 07/01/24 at  8:15 AM EDT by MyChart Video Visit and verified that I am speaking with the correct person using two identifiers. Nurse is located at [facility] and pt is located at Home.  I discussed the limitations, risks, security and privacy concerns of performing an evaluation and management service by telephone and the availability of in person appointments. I also discussed with the patient that there may be a patient responsible charge related to this service. The patient expressed understanding and agreed to proceed.  I explained I am completing New OB Intake today. We discussed EDD of 02/09/2025 based on LMP of 05/05/24. Pt is [id number]. I reviewed her allergies, medications and Medical/Surgical/OB history.  Viability scheduled 07/02/24 at 01:55pm per pt request.  She states she discharged from ER before completing US  on 06/11/24 due to childcare.  Patient Active Problem List   Diagnosis Date Noted   Encounter for supervision of low-risk pregnancy, antepartum 07/01/2024   Family history of gastroschisis 07/01/2024   Marijuana use 07/01/2024     Concerns addressed today Pt expressed concern about she was told today's visit was virtual and she prefers in person appointments.  RN offered to try and see if visit could be rescheduled as in person as an exception to her preference, pt declined and verbally agreed to proceed with virtual visit.  Patient reports her child born in 2014 was born at [facility] with gastroschisis, RN added this information to obstetric history.    Delivery Plans Plans to deliver at [facility]. Discussed the nature of our practice with multiple providers including residents and students as well as female and female providers. Due to the size of the practice, the delivering provider may not be the same as those providing prenatal care.   Patient is interested in water birth.  MyChart/Babyscripts MyChart access verified. I  explained pt will have some visits in office and some virtually. Babyscripts instructions given and order placed. Patient verifies receipt of registration text/e-mail. Account successfully created and app downloaded. If patient is a candidate for Optimized scheduling, add to sticky note.   Blood Pressure Cuff/Weight Scale Blood pressure cuff ordered for patient to pick-up from [employer]. Explained after first prenatal appt pt will check weekly and document in Babyscripts. Patient does have weight scale.  Anatomy US  Explained first scheduled US  will be around 19 weeks. Anatomy US  will be scheduled after viability US  per MFM.    Is patient a CenteringPregnancy candidate?  Declined Declined due to Group setting    Is patient a Mom+Baby Combined Care candidate?  Not a candidate       First visit review I reviewed new OB appt with patient. Explained pt will be seen by Dr. Fredirick at first visit 08/04/24 at 09:15am. Discussed [facility] genetic screening with patient. Pt desires [facility] and [facility]. Routine prenatal labs and genetic testing needed at [facility] visit.   Last Pap No results found for: DIAGPAP Pt states it's been at least 10 years since she's had a pap smear.    Waddell LITTIE Burows, RN 07/01/2024  9:26 AM

## 2024-07-02 ENCOUNTER — Other Ambulatory Visit: Payer: Self-pay | Admitting: Family Medicine

## 2024-07-02 ENCOUNTER — Ambulatory Visit (INDEPENDENT_AMBULATORY_CARE_PROVIDER_SITE_OTHER)

## 2024-07-02 DIAGNOSIS — Z3490 Encounter for supervision of normal pregnancy, unspecified, unspecified trimester: Secondary | ICD-10-CM

## 2024-07-02 DIAGNOSIS — Z3A08 8 weeks gestation of pregnancy: Secondary | ICD-10-CM | POA: Diagnosis not present

## 2024-07-02 DIAGNOSIS — F1290 Cannabis use, unspecified, uncomplicated: Secondary | ICD-10-CM

## 2024-07-02 DIAGNOSIS — Z3491 Encounter for supervision of normal pregnancy, unspecified, first trimester: Secondary | ICD-10-CM | POA: Diagnosis not present

## 2024-07-02 DIAGNOSIS — Z8279 Family history of other congenital malformations, deformations and chromosomal abnormalities: Secondary | ICD-10-CM

## 2024-08-04 ENCOUNTER — Other Ambulatory Visit (HOSPITAL_COMMUNITY)
Admission: RE | Admit: 2024-08-04 | Discharge: 2024-08-04 | Disposition: A | Discharge status: Home / Self Care | Source: Ambulatory Visit | Attending: Family Medicine | Admitting: Family Medicine

## 2024-08-04 ENCOUNTER — Other Ambulatory Visit: Payer: Self-pay

## 2024-08-04 ENCOUNTER — Encounter: Payer: Self-pay | Admitting: Family Medicine

## 2024-08-04 ENCOUNTER — Ambulatory Visit: Admitting: Family Medicine

## 2024-08-04 VITALS — BP 109/81 | HR 101 | Wt 137.1 lb

## 2024-08-04 DIAGNOSIS — O98311 Other infections with a predominantly sexual mode of transmission complicating pregnancy, first trimester: Secondary | ICD-10-CM

## 2024-08-04 DIAGNOSIS — Z3A13 13 weeks gestation of pregnancy: Secondary | ICD-10-CM | POA: Insufficient documentation

## 2024-08-04 DIAGNOSIS — F17290 Nicotine dependence, other tobacco product, uncomplicated: Secondary | ICD-10-CM | POA: Insufficient documentation

## 2024-08-04 DIAGNOSIS — Z3491 Encounter for supervision of normal pregnancy, unspecified, first trimester: Secondary | ICD-10-CM | POA: Diagnosis not present

## 2024-08-04 DIAGNOSIS — A6009 Herpesviral infection of other urogenital tract: Secondary | ICD-10-CM

## 2024-08-04 DIAGNOSIS — Z3490 Encounter for supervision of normal pregnancy, unspecified, unspecified trimester: Secondary | ICD-10-CM

## 2024-08-04 DIAGNOSIS — O09529 Supervision of elderly multigravida, unspecified trimester: Secondary | ICD-10-CM | POA: Insufficient documentation

## 2024-08-04 DIAGNOSIS — O09521 Supervision of elderly multigravida, first trimester: Secondary | ICD-10-CM

## 2024-08-04 NOTE — Progress Notes (Signed)
 Subjective:   Kayla Diaz is a 37 y.o. [id number] at [redacted]w[redacted]d by LMP, early ultrasound being seen today for her first obstetrical visit.  Her obstetrical history is significant for advanced maternal age and smoker. Pregnancy history fully reviewed.  Patient reports no complaints.  HISTORY: OB History  Gravida Para Term Preterm AB Living  6 4 4  0 1 4  SAB IAB Ectopic Multiple Live Births  1 0 0 0 4    # Outcome Date GA Lbr Len/2nd Weight Sex Type Anes PTL Lv  6 Current           5 Term 06/11/13 [redacted]w[redacted]d   F Vag-Spont None N LIV     Birth Comments: Gastroschisis     Name: Manuele,NBF Mishal     Apgar1: 2  Apgar5: 9  4 Term 09/20/09 [redacted]w[redacted]d  7 lb 12 oz (3.515 kg) M Vag-Spont None N LIV     Name: Aryn  3 Term 09/29/04 [redacted]w[redacted]d  7 lb 12 oz (3.515 kg) M Vag-Spont  N LIV     Name: Lonni  2 Term 11/24/98 [redacted]w[redacted]d  7 lb 10 oz (3.459 kg) M Vag-Spont EPI  LIV     Name: Donnice  1 SAB            Needs pap but does not want one  Past Medical History:  Diagnosis Date   Acute pyelonephritis 05/20/2024   Fetal gastroschisis in pregnancy, antepartum 05/01/2013   NST's  AFI's  Plan delivery between 39-40 wks,assuming fetal surveillance WNL     HSV infection    Past Surgical History:  Procedure Laterality Date   APPENDECTOMY     SHOULDER SURGERY     Family History  Problem Relation Age of Onset   Healthy Mother    Mental illness Sister    Breast cancer Maternal Grandmother    Breast cancer Other    Cancer Other        great aunt   Breast cancer Maternal Great Grandparent    Social History   Tobacco Use   Smoking status: Every Day    Current packs/day: 1.00    Types: Cigarettes   Smokeless tobacco: Never  Vaping Use   Vaping status: Never Used  Substance Use Topics   Alcohol use: No   Drug use: Yes    Types: Marijuana   Allergies  Allergen Reactions   Peanut-Containing Drug Products Shortness Of Breath, Itching and Swelling   Ibuprofen  [Ibuprofen ]      Causes stomach pain   Zofran  Hives and Itching   Chocolate Swelling and Rash   Prednisone  Rash   Current Outpatient Medications on File Prior to Visit  Medication Sig Dispense Refill   Prenatal Vit-Fe Fumarate-FA (PRENATAL PLUS VITAMIN/MINERAL) 27-1 MG TABS Take 1 tablet by mouth daily. 30 tablet 11   No current facility-administered medications on file prior to visit.     Exam   Vitals:   08/04/24 0937  BP: 109/81  Pulse: (!) 101  Weight: 137 lb 1.6 oz (62.2 kg)   Fetal Heart Rate (bpm): 157  System: General: well-developed, well-nourished female in no acute distress   Skin: normal coloration and turgor, no rashes   Neurologic: oriented, normal, negative, normal mood   Extremities: normal strength, tone, and muscle mass, ROM of all joints is normal   HEENT PERRLA, extraocular movement intact and sclera clear, anicteric   Mouth/Teeth mucous membranes moist, pharynx normal without lesions and dental hygiene good  Neck supple and no masses   Cardiovascular: regular rate and rhythm   Respiratory:  no respiratory distress, normal breath sounds   Abdomen: soft, non-tender; bowel sounds normal; no masses,  no organomegaly   Flowsheet Row Initial Prenatal [facility]04/20[facility]  PHQ-9 Total Score 5      08/04/2024    9:39 AM  GAD 7 : Generalized Anxiety Score  Nervous, Anxious, on Edge 1  Control/stop worrying 0  Worry too much - different things 1  Trouble relaxing 1  Restless 1  Easily annoyed or irritable 2  Afraid - awful might happen 0  Total GAD 7 Score 6       Assessment:   Pregnancy: [id number] Patient Active Problem List   Diagnosis Date Noted   AMA (advanced maternal age) multigravida 35+ 08/04/2024   Vaping nicotine dependence, tobacco product 08/04/2024   Encounter for supervision of low-risk pregnancy, antepartum 07/01/2024   Family history of gastroschisis 07/01/2024   Marijuana use  07/01/2024   Genital herpes 05/01/2013     Plan:  1. [redacted] weeks gestation of pregnancy (Primary)  - GC/Chlamydia probe amp ([place])not at [facility]  2. Multigravida of advanced maternal age in first trimester NIPT testing  3. Encounter for supervision of low-risk pregnancy, antepartum New OB labs - she will return for these - Culture, OB Urine; Future - CBC/D/Plt+RPR+Rh+ABO+RubIgG...; Future - Hemoglobin A1c; Future - PANORAMA PRENATAL TEST; Future - HORIZON Basic Panel; Future  4. Genital herpes Will need ppx 34-36 weeks  5. Vaping nicotine dependence, tobacco product Discussed minimizing use in pregnancy   Initial labs drawn. Continue prenatal vitamins. Genetic Screening discussed, NIPS: ordered. Ultrasound discussed; fetal anatomic survey: ordered. Problem list reviewed and updated.  Routine obstetric precautions reviewed. Return in 4 weeks (on 09/01/2024).

## 2024-08-05 LAB — GC/CHLAMYDIA PROBE AMP (~~LOC~~) NOT AT ARMC
Chlamydia: NEGATIVE
Comment: NEGATIVE
Comment: NORMAL
Neisseria Gonorrhea: NEGATIVE

## 2024-09-02 ENCOUNTER — Encounter

## 2024-09-16 ENCOUNTER — Other Ambulatory Visit: Payer: Self-pay | Admitting: *Deleted

## 2024-09-16 ENCOUNTER — Ambulatory Visit

## 2024-09-16 ENCOUNTER — Ambulatory Visit: Attending: Obstetrics and Gynecology | Admitting: Obstetrics

## 2024-09-16 VITALS — BP 110/72

## 2024-09-16 DIAGNOSIS — O98313 Other infections with a predominantly sexual mode of transmission complicating pregnancy, third trimester: Secondary | ICD-10-CM | POA: Diagnosis not present

## 2024-09-16 DIAGNOSIS — O99322 Drug use complicating pregnancy, second trimester: Secondary | ICD-10-CM | POA: Diagnosis not present

## 2024-09-16 DIAGNOSIS — Z36 Encounter for antenatal screening for chromosomal anomalies: Secondary | ICD-10-CM | POA: Diagnosis not present

## 2024-09-16 DIAGNOSIS — Z3A19 19 weeks gestation of pregnancy: Secondary | ICD-10-CM | POA: Insufficient documentation

## 2024-09-16 DIAGNOSIS — F17200 Nicotine dependence, unspecified, uncomplicated: Secondary | ICD-10-CM | POA: Diagnosis not present

## 2024-09-16 DIAGNOSIS — O99333 Smoking (tobacco) complicating pregnancy, third trimester: Secondary | ICD-10-CM | POA: Diagnosis not present

## 2024-09-16 DIAGNOSIS — Z363 Encounter for antenatal screening for malformations: Secondary | ICD-10-CM | POA: Insufficient documentation

## 2024-09-16 DIAGNOSIS — O09523 Supervision of elderly multigravida, third trimester: Secondary | ICD-10-CM | POA: Insufficient documentation

## 2024-09-16 DIAGNOSIS — A6009 Herpesviral infection of other urogenital tract: Secondary | ICD-10-CM | POA: Diagnosis not present

## 2024-09-16 DIAGNOSIS — Z3490 Encounter for supervision of normal pregnancy, unspecified, unspecified trimester: Secondary | ICD-10-CM | POA: Diagnosis not present

## 2024-09-16 DIAGNOSIS — Z3689 Encounter for other specified antenatal screening: Secondary | ICD-10-CM | POA: Diagnosis not present

## 2024-09-16 DIAGNOSIS — O09522 Supervision of elderly multigravida, second trimester: Secondary | ICD-10-CM | POA: Insufficient documentation

## 2024-09-16 DIAGNOSIS — O352XX0 Maternal care for (suspected) hereditary disease in fetus, not applicable or unspecified: Secondary | ICD-10-CM | POA: Diagnosis not present

## 2024-09-16 NOTE — Progress Notes (Signed)
 MFM Consult Note  Kayla Diaz is currently at [redacted]w[redacted]d. She was seen today for a detailed fetal anatomy scan due to advanced maternal age (39 years old).    Her last pregnancy 11 years ago was complicated by a fetus with gastroschisis.  That child is doing well today after surgical repair.  She denies denies any problems in her current pregnancy. Her blood pressure today was 110/72.  She has not had a screening test for fetal aneuploidy drawn in her current pregnancy.  Sonographic findings Single intrauterine pregnancy at 19w 1d. Fetal cardiac activity:  Observed and appears normal. Presentation: Cephalic. The anatomic structures that were well seen appear normal. The anatomic survey is complete.  She was reassured that there were no signs of gastroschisis noted today. Fetal biometry shows the estimated fetal weight of 0 lb 12 oz,  335 grams (94%). Amniotic fluid: Within normal limits.  MVP: 4.48 cm. Placenta: Anterior. Adnexa: No abnormality visualized. Cervical length: 3 cm.  The patient was informed that anomalies may be missed due to technical limitations. If the fetus is in a suboptimal position or maternal habitus is increased, visualization of the fetus in the maternal uterus may be impaired.  Advanced maternal age  The increased risk of fetal aneuploidy due to advanced maternal age was discussed.   Due to advanced maternal age, the patient was offered and declined an amniocentesis today for definitive diagnosis of fetal aneuploidy.   She had the Panorama cell free DNA test drawn following today's ultrasound exam to screen for fetal aneuploidy.  Our genetic counselor will notify the patient regarding the results of this test.    Due to advanced maternal age, we will continue to follow her with growth ultrasounds throughout her pregnancy.    Weekly fetal testing should be started at around 34 weeks and continued until delivery.    Delivery should be considered at  between 39 to 40 weeks.    She will return in 5 weeks for another growth ultrasound.  As she is over 5 years old, weekly fetal testing should be considered starting at around 32 weeks.   The patient stated that all of her questions were answered.   A total of 30 minutes was spent counseling and coordinating the care for this patient.  Greater than 50% of the time was spent in direct face-to-face contact.

## 2024-09-23 LAB — PANORAMA PRENATAL TEST FULL PANEL:PANORAMA TEST PLUS 5 ADDITIONAL MICRODELETIONS: FETAL FRACTION: 16.3

## 2024-09-24 ENCOUNTER — Ambulatory Visit: Payer: Self-pay

## 2024-09-25 NOTE — Progress Notes (Signed)
 Appointment rescheduled due to weather.   Camie Rote, MSN, CNM, RNC-OB Certified Nurse Midwife, [facility] 10/01/2024 11:18 AM

## 2024-09-30 ENCOUNTER — Ambulatory Visit: Admitting: Certified Nurse Midwife

## 2024-09-30 DIAGNOSIS — F1290 Cannabis use, unspecified, uncomplicated: Secondary | ICD-10-CM

## 2024-09-30 DIAGNOSIS — A6009 Herpesviral infection of other urogenital tract: Secondary | ICD-10-CM

## 2024-09-30 DIAGNOSIS — Z3A21 21 weeks gestation of pregnancy: Secondary | ICD-10-CM

## 2024-09-30 DIAGNOSIS — Z3490 Encounter for supervision of normal pregnancy, unspecified, unspecified trimester: Secondary | ICD-10-CM

## 2024-09-30 DIAGNOSIS — Z8279 Family history of other congenital malformations, deformations and chromosomal abnormalities: Secondary | ICD-10-CM

## 2024-09-30 DIAGNOSIS — O09522 Supervision of elderly multigravida, second trimester: Secondary | ICD-10-CM

## 2024-09-30 DIAGNOSIS — F17290 Nicotine dependence, other tobacco product, uncomplicated: Secondary | ICD-10-CM

## 2024-10-09 ENCOUNTER — Encounter: Payer: Self-pay | Admitting: Obstetrics & Gynecology

## 2024-10-23 ENCOUNTER — Ambulatory Visit

## 2024-10-23 ENCOUNTER — Other Ambulatory Visit

## 2024-10-28 ENCOUNTER — Encounter: Admitting: Obstetrics and Gynecology

## 2024-11-11 ENCOUNTER — Encounter

## 2024-11-11 ENCOUNTER — Other Ambulatory Visit

## 2024-12-02 ENCOUNTER — Encounter: Admitting: Physician Assistant
